# Patient Record
Sex: Female | Born: 1975 | Race: Black or African American | Hispanic: No | Marital: Married | Smoking: Never smoker
Health system: Southern US, Community
[De-identification: ages and names within clinical notes are randomized; demographics above are authoritative.]

## PROBLEM LIST (undated history)

## (undated) DIAGNOSIS — M1990 Unspecified osteoarthritis, unspecified site: Secondary | ICD-10-CM

## (undated) DIAGNOSIS — E669 Obesity, unspecified: Secondary | ICD-10-CM

## (undated) DIAGNOSIS — K5900 Constipation, unspecified: Secondary | ICD-10-CM

## (undated) DIAGNOSIS — K219 Gastro-esophageal reflux disease without esophagitis: Secondary | ICD-10-CM

## (undated) DIAGNOSIS — M21612 Bunion of left foot: Secondary | ICD-10-CM

## (undated) DIAGNOSIS — E559 Vitamin D deficiency, unspecified: Secondary | ICD-10-CM

## (undated) HISTORY — DX: Constipation, unspecified: K59.00

## (undated) HISTORY — PX: BUNIONECTOMY: SHX129

## (undated) HISTORY — PX: OTHER SURGICAL HISTORY: SHX169

## (undated) HISTORY — DX: Obesity, unspecified: E66.9

## (undated) HISTORY — PX: WISDOM TOOTH EXTRACTION: SHX21

## (undated) HISTORY — PX: BACK SURGERY: SHX140

## (undated) HISTORY — DX: Gastro-esophageal reflux disease without esophagitis: K21.9

## (undated) HISTORY — DX: Vitamin D deficiency, unspecified: E55.9

---

## 1998-08-03 ENCOUNTER — Emergency Department (HOSPITAL_COMMUNITY): Admission: EM | Admit: 1998-08-03 | Discharge: 1998-08-03 | Payer: Self-pay | Admitting: Emergency Medicine

## 1998-08-12 ENCOUNTER — Emergency Department (HOSPITAL_COMMUNITY): Admission: EM | Admit: 1998-08-12 | Discharge: 1998-08-12 | Payer: Self-pay | Admitting: Emergency Medicine

## 2001-11-27 ENCOUNTER — Other Ambulatory Visit: Admission: RE | Admit: 2001-11-27 | Discharge: 2001-11-27 | Payer: Self-pay | Admitting: Internal Medicine

## 2002-01-27 ENCOUNTER — Encounter: Admission: RE | Admit: 2002-01-27 | Discharge: 2002-04-27 | Payer: Self-pay | Admitting: Internal Medicine

## 2003-03-04 ENCOUNTER — Other Ambulatory Visit: Admission: RE | Admit: 2003-03-04 | Discharge: 2003-03-04 | Payer: Self-pay | Admitting: Gynecology

## 2004-03-08 ENCOUNTER — Other Ambulatory Visit: Admission: RE | Admit: 2004-03-08 | Discharge: 2004-03-08 | Payer: Self-pay | Admitting: Gynecology

## 2004-04-25 ENCOUNTER — Encounter (INDEPENDENT_AMBULATORY_CARE_PROVIDER_SITE_OTHER): Payer: Self-pay | Admitting: Specialist

## 2004-04-25 ENCOUNTER — Inpatient Hospital Stay (HOSPITAL_COMMUNITY): Admission: RE | Admit: 2004-04-25 | Discharge: 2004-04-27 | Payer: Self-pay | Admitting: Obstetrics and Gynecology

## 2004-04-25 HISTORY — PX: EXPLORATORY LAPAROTOMY: SUR591

## 2005-04-24 ENCOUNTER — Inpatient Hospital Stay (HOSPITAL_COMMUNITY): Admission: RE | Admit: 2005-04-24 | Discharge: 2005-04-27 | Payer: Self-pay | Admitting: Gynecology

## 2005-06-11 ENCOUNTER — Other Ambulatory Visit: Admission: RE | Admit: 2005-06-11 | Discharge: 2005-06-11 | Payer: Self-pay | Admitting: Gynecology

## 2006-06-25 ENCOUNTER — Other Ambulatory Visit: Admission: RE | Admit: 2006-06-25 | Discharge: 2006-06-25 | Payer: Self-pay | Admitting: Obstetrics and Gynecology

## 2007-07-03 ENCOUNTER — Other Ambulatory Visit: Admission: RE | Admit: 2007-07-03 | Discharge: 2007-07-03 | Payer: Self-pay | Admitting: Obstetrics and Gynecology

## 2008-07-13 ENCOUNTER — Other Ambulatory Visit: Admission: RE | Admit: 2008-07-13 | Discharge: 2008-07-13 | Payer: Self-pay | Admitting: Obstetrics and Gynecology

## 2009-04-06 ENCOUNTER — Ambulatory Visit: Payer: Self-pay | Admitting: Obstetrics and Gynecology

## 2009-07-18 ENCOUNTER — Ambulatory Visit: Payer: Self-pay | Admitting: Obstetrics and Gynecology

## 2009-07-18 ENCOUNTER — Other Ambulatory Visit: Admission: RE | Admit: 2009-07-18 | Discharge: 2009-07-18 | Payer: Self-pay | Admitting: Obstetrics and Gynecology

## 2009-07-18 ENCOUNTER — Encounter: Payer: Self-pay | Admitting: Obstetrics and Gynecology

## 2009-11-03 ENCOUNTER — Ambulatory Visit: Payer: Self-pay | Admitting: Obstetrics and Gynecology

## 2010-01-12 ENCOUNTER — Ambulatory Visit: Payer: Self-pay | Admitting: Women's Health

## 2010-06-21 ENCOUNTER — Ambulatory Visit: Payer: Self-pay | Admitting: Obstetrics and Gynecology

## 2010-07-20 ENCOUNTER — Other Ambulatory Visit: Admission: RE | Admit: 2010-07-20 | Discharge: 2010-07-20 | Payer: Self-pay | Admitting: Obstetrics and Gynecology

## 2010-07-20 ENCOUNTER — Ambulatory Visit: Payer: Self-pay | Admitting: Obstetrics and Gynecology

## 2011-01-05 ENCOUNTER — Ambulatory Visit (INDEPENDENT_AMBULATORY_CARE_PROVIDER_SITE_OTHER): Payer: BC Managed Care – PPO | Admitting: Obstetrics and Gynecology

## 2011-01-05 DIAGNOSIS — N912 Amenorrhea, unspecified: Secondary | ICD-10-CM

## 2011-01-09 ENCOUNTER — Other Ambulatory Visit: Payer: BC Managed Care – PPO

## 2011-01-09 ENCOUNTER — Ambulatory Visit (INDEPENDENT_AMBULATORY_CARE_PROVIDER_SITE_OTHER): Payer: BC Managed Care – PPO | Admitting: Obstetrics and Gynecology

## 2011-01-09 DIAGNOSIS — N912 Amenorrhea, unspecified: Secondary | ICD-10-CM

## 2011-01-09 DIAGNOSIS — O3410 Maternal care for benign tumor of corpus uteri, unspecified trimester: Secondary | ICD-10-CM

## 2011-01-09 DIAGNOSIS — O9989 Other specified diseases and conditions complicating pregnancy, childbirth and the puerperium: Secondary | ICD-10-CM

## 2011-02-01 ENCOUNTER — Ambulatory Visit (INDEPENDENT_AMBULATORY_CARE_PROVIDER_SITE_OTHER): Payer: BC Managed Care – PPO | Admitting: Obstetrics and Gynecology

## 2011-02-01 DIAGNOSIS — N898 Other specified noninflammatory disorders of vagina: Secondary | ICD-10-CM

## 2011-02-01 DIAGNOSIS — B373 Candidiasis of vulva and vagina: Secondary | ICD-10-CM

## 2011-02-12 ENCOUNTER — Other Ambulatory Visit: Payer: Self-pay | Admitting: Obstetrics & Gynecology

## 2011-02-28 ENCOUNTER — Ambulatory Visit (HOSPITAL_COMMUNITY)
Admission: RE | Admit: 2011-02-28 | Discharge: 2011-02-28 | Disposition: A | Discharge status: Home / Self Care | Payer: BC Managed Care – PPO | Source: Ambulatory Visit | Attending: Obstetrics & Gynecology | Admitting: Obstetrics & Gynecology

## 2011-02-28 ENCOUNTER — Other Ambulatory Visit: Payer: Self-pay | Admitting: Obstetrics & Gynecology

## 2011-02-28 DIAGNOSIS — O021 Missed abortion: Secondary | ICD-10-CM | POA: Insufficient documentation

## 2011-02-28 HISTORY — PX: DILATION AND EVACUATION: SHX1459

## 2011-02-28 LAB — CBC
Hemoglobin: 13.1 g/dL (ref 12.0–15.0)
Platelets: 244 10*3/uL (ref 150–400)
RBC: 4.42 MIL/uL (ref 3.87–5.11)
WBC: 6 10*3/uL (ref 4.0–10.5)

## 2011-02-28 LAB — ABO/RH: ABO/RH(D): O POS

## 2011-03-05 NOTE — Op Note (Signed)
  [name], [name]                ACCOUNT NO.:  [id number]  MEDICAL RECORD NO.:  [id number]           PATIENT TYPE:  O  LOCATION:  [facility]                          [facility]  PHYSICIAN:  Ilda Mori, M.D.   DATE OF BIRTH:  12-07-1975  DATE OF PROCEDURE:  02/28/2011 DATE OF DISCHARGE:                              OPERATIVE REPORT   PREOPERATIVE DIAGNOSIS:  Missed abortion.  POSTOPERATIVE DIAGNOSIS:  Missed abortion.  PROCEDURE:  Dilatation and evacuation.  SURGEON:  Ilda Mori, MD  ANESTHESIA:  MAC with a paracervical block.  FINDINGS:  Products of conception consistent with 9 weeks pregnancy.  ESTIMATED BLOOD LOSS:  20 mL.  COMPLICATIONS:  None.  INDICATIONS:  This is a 35 year old gravida 2, para 1 female who presented to the office in early pregnancy.  The patient had an ultrasound done on January 09, 2011, which showed a 6-week fetal pole with a positive heartbeat.  Today, she came in for a followup ultrasound without any symptoms of bleeding or cramping.  The ultrasound showed an 8-week fetal pole with no fetal heartbeat.  Based upon the lack of fetal heart tones and the lack of growth in the 6-week interim period, the diagnosis of missed abortion was made.  The patient was informed, and she elected to proceed with dilatation and evacuation.  PROCEDURE:  The patient was taken to the operating room and IV sedation was administered.  She was prepped and draped in sterile fashion.  A 10 mL of 2% lidocaine was infiltrated in the paracervical tissues.  The internal os was dilated with Shawnie Pons dilators.  A 25-French and #8 suction curette was introduced into the endometrial cavity, and the products of conception were evacuated.  The procedure was then terminated, and the patient left the operating room in good condition.     Ilda Mori, M.D.    RK/MEDQ  D:  02/28/2011  T:  11-Mar-2011  Job:  [id number]  Electronically Signed by Ilda Mori M.D. on  03/05/2011 02:04:24 PM

## 2011-03-20 DEATH — deceased

## 2011-04-06 NOTE — Op Note (Signed)
[name], [name]                            ACCOUNT NO.:  [id number]   MEDICAL RECORD NO.:  [id number]                   PATIENT TYPE:  INP   LOCATION:  X010                                 FACILITY:  [facility]   PHYSICIAN:  Daniel L. Eda Paschal, M.D.           DATE OF BIRTH:  1976/05/22   DATE OF PROCEDURE:  04/25/2004  DATE OF DISCHARGE:                                 OPERATIVE REPORT   PREOPERATIVE DIAGNOSES:  1. Fibroid.  2. Pelvic pain.  3. Probable right ovarian cyst.   POSTOPERATIVE DIAGNOSES:  1. Fibroid.  2. Pelvic pain.   OPERATION:  Exploratory laparotomy with myomectomy.   SURGEON:  Dr. Eda Paschal.   FIRST ASSISTANT:  Dr. Rosalia Hammers   FINDINGS:  At the time of the surgery, patient had a very large, 8-9 cm  fibroid that was bilobed and almost gave the appearance of being two  fibroids which it might have been, but I think it was just one very large,  bilobed fibroid.  It originated in her interligamentary area on the right  side near the uterine artery in front of the round ligament and went down  into the broad ligament and also into the anterior portion of the uterus.  Fallopian tubes were normal with luxuriant fimbria.  Ovaries were normal.  The previous ovarian cyst seen on ultrasound had resolved.  Pelvic  peritoneum was free of any disease.  The rest of the exploration of the  abdomen was normal.   DESCRIPTION OF PROCEDURE:  After adequate general anesthesia, the patient  was placed in the supine position, prepped and draped in the usual sterile  manner.  A Foley catheter was inserted in the patient's bladder.  A  pediatric Foley was inserted into the patient's uterus.  A Pfannenstiel  incision was made.  The fascia was opened transversely.  The peritoneum was  entered vertically.  Subcutaneous bleeders were clamped and Bovie'd as  encountered.  When the peritoneal cavity was opened, initially washings were  obtained because of concern about a right ovarian  neoplasm, but they were  discarded after the adnexa were examined and noted to be free of any  disease.  The myoma was easily identified.  It was deviating the uterus.  The peritoneum over the myoma was dissected free in order to see the myoma  better.  Pitressin was utilized in a 20 and 100 mL dilution solution, and  then the myoma was cut into.  It quickly became obvious that it was a  bilobed myoma.  It was dissected free from surrounding structures.  It did  not really appear to go deep into the myometrium whatsoever and was more of  a superficial myoma than a deep myoma.  It clearly did not get anywhere near  the endometrial cavity.  A uterine artery was ligated and bled, but it was  quickly ligated doubly with 0 Vicryl.  Finally, the entire  structure was  removed.  The myometrial defect was closed with a running locking 0 Vicryl.  Several additional 0 Vicryl figure-of-eights were done to control bleeding.  Indigo carmine had been introduced prior to starting the dissection, and  both tubes were patent. It was then reintroduced after the procedure, and  the tubes were still patent.  The serosal defect was closed with a running 0  Prolene.  Copious irrigation was done with sterile saline.  Two sponge,  needle, and instrument counts were correct.  Intercede was placed over the  serosal defect.  The peritoneum was closed with a running 0  Vicryl.  The fascia was closed with two running 0 Vicryl, and the skin was  closed with staples.  Estimated blood loss for the entire procedure was 450  mL with none replaced.  The patient tolerated the procedure well and left  the operating room in satisfactory condition draining clear urine from her  Foley catheter.                                               Daniel L. Eda Paschal, M.D.    [name]  D:  04/25/2004  T:  04/25/2004  Job:  [id number]

## 2011-04-06 NOTE — Discharge Summary (Signed)
NAMEMARILENA, [name]                            ACCOUNT NO.:  [id number]   MEDICAL RECORD NO.:  [id number]                   PATIENT TYPE:  INP   LOCATION:  0448                                 FACILITY:  [facility]   PHYSICIAN:  Daniel L. Eda Paschal, M.D.           DATE OF BIRTH:  03-23-1976   DATE OF ADMISSION:  04/25/2004  DATE OF DISCHARGE:  04/27/2004                                 DISCHARGE SUMMARY   HISTORY OF PRESENT ILLNESS:  The patient is a 35 year old female who was  admitted to the hospital with pelvic pain and a large fibroid for definitive  surgery.   HOSPITAL COURSE:  On the day of admission, she was taken to the operating  room.  An exploratory laparotomy and myomectomy was performed.  Postoperatively she did well, and by the second postoperative day she was  voiding well, tolerating a diet, and was ready for discharge.   DISCHARGE MEDICATIONS:  Hydrocodone 5 mg for pain relief.   WOUND CARE:  Routine.   FOLLOW UP:  She will return to the office tomorrow for staple removal.   The final pathology report is not available at the time of dictation.   CONDITION ON DISCHARGE:  Improved.   DISCHARGE DIAGNOSIS:  Large fibroid with pelvic pain.   OPERATION:  Myomectomy done abdominally.                                               Daniel L. Eda Paschal, M.D.    [name]  D:  04/27/2004  T:  04/27/2004  Job:  [id number]

## 2011-04-06 NOTE — Op Note (Signed)
[name], [name]                ACCOUNT NO.:  [id number]   MEDICAL RECORD NO.:  [id number]          PATIENT TYPE:  INP   LOCATION:  9123                          [facility]   PHYSICIAN:  Juan H. Lily Peer, M.D.DATE OF BIRTH:  02-11-1976   DATE OF PROCEDURE:  04/24/2005  DATE OF DISCHARGE:                                 OPERATIVE REPORT   PREOPERATIVE DIAGNOSES:  1.  Term intrauterine pregnancy.  2.  Prior myomectomy.   POSTOPERATIVE DIAGNOSES:  1.  Term intrauterine pregnancy.  2.  Prior myomectomy.   ANESTHESIA:  Spinal.   PROCEDURE PERFORMED:  Primary lower uterine segment transverse cesarean  section.   SURGEON:  Juan H. Lily Peer, M.D.   FIRST ASSISTANT:  Timothy P. Fontaine, M.D.   INDICATION FOR OPERATION:  A 35 year old gravida 2, para 0, AB 1, at 2  weeks' estimated gestational age, for a primary cesarean section due to the  fact that she had prior abdominal myomectomy.  Patient also with morbid  obesity.   DESCRIPTION OF OPERATION:  After the patient was adequately counseled, she  was taken to the operating room, where she underwent a successful spinal  placement.  The abdomen was prepped and draped in the usual sterile fashion.  A Foley catheter had been inserted in an effort to monitor urinary output.  A Pfannenstiel skin incision was made 2 cm above the symphysis.  The  incision was carried down from the skin and subcutaneous tissue down to the  rectus fascia, whereby a midline nick was made.  The fascia was incised in a  transverse fashion.  The peritoneal cavity was entered cautiously.  Although  there was an extensive amount of adhesions and scarring from previous  myomectomy, meticulous dissection with lysis of pelvic adhesions was  undertaken.  A muscle-splitting technique (Maylard) was utilized in an  effort to have adequate exposure.  The bladder flap was established.  The  lower uterine segment was incised in a transverse fashion.  Clear  amniotic  fluid was present.  The newborn's head was delivered with the assistance of  a Tender Touch vacuum.  The nasopharyngeal area was bulb-suctioned and the  newborn was delivered.  The cord was doubly clamped and excised, shown to  the parents and passed off to the pediatricians who were in attendance, who  gave the following parameters:  A viable female infant, Apgars of 7 and 8,  with a weight of 8 pounds and Apgars were 7 and 8.  The cord blood was  obtained.  The placenta was delivered from the intrauterine cavity.  The  lower uterine segment was closed in a two-layered fashion, first with a  locking stitch of 0 Vicryl suture, followed by a second layer of 0 Vicryl  suture in an imbricating manner.  Tubes and ovaries appeared to be normal,  and scarring from the anterior uterine wall from previous myomectomy was  evident but otherwise intact.  The uterus was placed back in the pelvic  cavity. The pelvic cavity was copiously irrigated with normal saline  solution.  The visceral peritoneum was  not reapproximated, but the rectus  fascia was closed with a running stitch of 0 Vicryl suture.  The  subcutaneous bleeders were Bovie cauterized.  The skin was reapproximated  with skin clips, followed by placement of Xeroform gauze and 4 x 8  dressings.  The patient was transferred to the  recovery room with stable vital signs.  Blood loss for the procedure was  recorded at 700 mL.  IV fluids:  3300 mL of lactated Ringer's. Urine output:  200 mL and clear.  The patient received 1 g of Cefotan prophylactically  after the cord was clamped.      JHF/MEDQ  D:  04/24/2005  T:  04/24/2005  Job:  [id number]

## 2011-04-06 NOTE — H&P (Signed)
[name], [name]                ACCOUNT NO.:  [id number]   MEDICAL RECORD NO.:  [id number]          PATIENT TYPE:  INP   LOCATION:  NA                            [facility]   PHYSICIAN:  Juan H. Lily Peer, M.D.DATE OF BIRTH:  05-Oct-1976   DATE OF ADMISSION:  DATE OF DISCHARGE:                                HISTORY & PHYSICAL   The patient is scheduled for surgery tomorrow April 24, 2005 at 1:00 p.m. at  [facility], please have history and physical available.   CHIEF COMPLAINT:  1.  Term intrauterine pregnancy.  2.  Leiomyomatous uteri.  3.  History of prior abdominal myomectomy.  4.  Morbid obesity.   HISTORY:  The patient is a 35 year old gravida 2, para 0, AB 1, with  corrected estimated date of confinement of May 02, 2005, the patient will  be [redacted] weeks gestation at the time of her planned primary cesarean section.  The patient with history of abdominal myomectomy in 2005 and has history of  several fibroids on her uterus.  Due to the patients morbid obesity, she was  sent to [facility] for a Level III Scan for better assessment of the  baby, and no gross anomalies was noted.  Initially there was low to normal  volume of the amniotic fluid, but on followup ultrasound the amniotic fluid  volumes were normal and good interval growth.  The patient at first prenatal  visit was weighing 248 pounds, at the last office visit on Apr 17, 2005 she  was up to 300 pounds.  She did have a normal diabetes screen and negative  GBS culture.  The patient had declined cystic fibrosis screen as well as a  alpha-fetoprotein testing.  The remainder of her prenatal course was  otherwise unremarkable.   PAST MEDICAL HISTORY:  SHE DENIES ANY ALLERGIES.  She has had one elective  abortion in 1997.  She has cats at home.  Toxoplasmosis screen was negative  in our office.   REVIEW OF SYSTEMS:  See [place] form.   PHYSICAL EXAMINATION:  Blood pressure 104/72, urine with trace  protein,  negative glucose.  HEENT:  Unremarkable, neck supple, trachea midline, no carotid bruits, no  thyromegaly.  LUNGS:  Clear to auscultation without rhonchi or wheezes.  HEART:  Regular rate and rhythm, no murmurs or gallops.  BREAST:  Exam not done.  ABDOMEN:  Gravid uterus, vertex presentation by [facility] maneuver, 39 cm  fundal height, cervix was long, closed and posterior.  EXTREMITIES:  DTR 1+, negative clonus, trace edema.   PRENATAL LABS:  O positive blood type.  Negative antibody screen.  VDRL was  not reactive.  Rubella immune.  Hepatitis B surface antigen and HIV were  negative.  The patient declined cystic fibrosis screening as well as  maternal __________ protein, diabetes screen was normal.  GBS culture was  negative.   ASSESSMENT:  A 35 year old gravida 2, para 1, AB 0 at 41 weeks estimated  gestational age with morbid obesity with history of abdominal myomectomy.  Risk of uterine rupture at trial of labor versus  a cesarean section have  been discussed with the patient.  The patient had opted to proceed with a  primary cesarean section.  The patient risks and benefits, pros and cons  were discussed including infection, bleeding, trauma to internal organs,  pulmonary embolism.  The patient fully aware and accepts, and will follow  accordingly.   PLAN:  The patient was scheduled for primary lower uterine segment  transverse cesarean section tomorrow, April 24, 2005 at [facility].  Please have History and Physical available.      JHF/MEDQ  D:  04/23/2005  T:  04/23/2005  Job:  [id number]

## 2011-04-06 NOTE — H&P (Signed)
[name], RADEBAUGH                            ACCOUNT NO.:  [id number]   MEDICAL RECORD NO.:  [id number]                   PATIENT TYPE:  INP   LOCATION:  NA                                   FACILITY[facility]  [facility]   PHYSICIAN:  Daniel L. Eda Paschal, M.D.           DATE OF BIRTH:  1976-06-11   DATE OF ADMISSION:  04/25/2004  DATE OF DISCHARGE:                                HISTORY & PHYSICAL   CHIEF COMPLAINT:  Fibroid with pelvic pain, right ovarian cyst.   HISTORY OF PRESENT ILLNESS:  The patient is a 35 year old gravida 1, para 0,  Ab1, who presented to the office in May of this year with severe lower  abdominal/pelvic pain.  Pelvic examination revealed a pelvic mass and  ultrasound revealed a 6 x 8-cm fibroid that appeared to be degenerating; in  addition to that, the patient has a 5 x 3-cm cyst on her right ovary.  Although her pain has gotten better, it is felt that the patient should go  ahead and have surgery to remove such a large fibroid in anticipation that  she may want to conceive down the road; she is, however, having a fair bit  of discomfort from it as well.  The plan will be to do a myomectomy and if  the right ovarian cyst is still present, as it has been a month, to do a  right ovarian cystectomy.  The patient has given me permission to do a right  salpingo-oophorectomy if that is more appropriate for treatment of the  adnexal mass and she does appreciate there is some risk of hysterectomy when  you do a myomectomy.   PAST MEDICAL HISTORY:  No previous medical problems.  She had a cyst removed  from her right eye.   MEDICATIONS:  She takes Estrostep for birth control.   ALLERGIES:  She is allergic to no drugs.   FAMILY HISTORY:  She has an aunt who is diabetic and mother and grandfather  who are hypertensive.  Maternal grandparents have had pancreatic and bone  cancer on the mother's side of the family.   SOCIAL HISTORY:  The patient is a Midwife.   She is a nonsmoker,  nondrinker.   REVIEW OF SYSTEMS:  HEENT:  Negative.  CARDIAC:  Negative.  RESPIRATORY:  Negative.  GI:  Negative except for lower abdominal pain.  GU:  Negative.  NEUROMUSCULAR:  Negative.  NEUROLOGICAL/PSYCHIATRIC:  Negative.  IMMUNOLOGICAL, LYMPHATIC AND ENDOCRINE:  Negative.   PHYSICAL EXAMINATION:  GENERAL:  The patient is a well-developed and well-  nourished female in no acute distress.  VITAL SIGNS:  Her blood pressure is 102/64, pulse is 80 and regular,  respirations 16 and nonlabored, she is afebrile.  HEENT:  HEENT is all within normal limits.  NECK:  Neck is supple, trachea in the midline, thyroid is not enlarged.  LUNGS: Lungs are clear to P&A.  HEART:  No thrills, heaves or murmurs.  BREASTS:  No masses.  ABDOMEN:  Abdomen is soft without guarding, rebound or masses.  PELVIC:  External and vaginal are normal.  Cervix is clean.  Pap smear shows  no atypia.  Uterus is enlarged by a pelvic mass of about 8 cm.  Adnexa are  palpably normal, as I cannot differentiate the right adnexal mass from the  fibroid.  Rectovaginal is confirmatory.  EXTREMITIES:  Extremities are within normal limits.   ADMITTING IMPRESSION:  1. Large fibroid with pain.  2  Right ovarian cyst.   PLAN:  Exploratory laparotomy with treatment as outlined above.                                               Daniel L. Eda Paschal, M.D.    Tonette Bihari  D:  04/24/2004  T:  04/24/2004  Job:  [id number]

## 2011-04-06 NOTE — Discharge Summary (Signed)
[name], [name]                ACCOUNT NO.:  [id number]   MEDICAL RECORD NO.:  [id number]          PATIENT TYPE:  INP   LOCATION:  9123                          [facility]ACILITY[facility]   PHYSICIAN:  Ivor Costa. Farrel Gobble, M.D. DATE OF BIRTH:  04/20/1976   DATE OF ADMISSION:  04/24/2005  DATE OF DISCHARGE:  04/27/2005                                 DISCHARGE SUMMARY   PRINCIPAL DIAGNOSES:  Term pregnancy.   ADDITIONAL DIAGNOSES:  previos myomectomy .   PRINCIPAL PROCEDURE:  Primary cesarean section.   HOSPITAL COURSE:  Patient presented on the morning of April 24, 2005 and  underwent a primary cesarean section low flap transverse secondary to  history of prior myomectomy with delivery of a viable female with Apgars of  7/8, birth weight of 8 pounds under epidural anesthesia.  Her postoperative  course was unremarkable.  She was transferred to the PACU and then to  postoperative floor in due fashion.  By postoperative day #3 the patient was  ready for discharge.  She was breast-feeding without any difficulty, using  only over-the-counter Motrin for pain relief.   PHYSICAL EXAMINATION:  HEART:  Regular rate.  LUNGS:  Clear to auscultation.  ABDOMEN:  Obese, soft, nontender with bowel sounds.  Incision was clean,  dry, intact with staples.  GENITOURINARY:  Uterus was firm, nontender and not to the umbilicus.  EXTREMITIES:  Nontender.   LABORATORIES:  Postoperative hemoglobin 10.4, hematocrit 30.8, platelets  217, white count 10.   DISPOSITION:  Stable.  The patient was discharged home with instructions to  follow up in our office in six weeks.  She will continue her over-the-  counter Motrin.  The staples will be removed prior to discharge.       THL/MEDQ  D:  04/27/2005  T:  04/27/2005  Job:  [id number]

## 2011-10-22 ENCOUNTER — Other Ambulatory Visit (HOSPITAL_COMMUNITY): Payer: Self-pay | Admitting: Obstetrics & Gynecology

## 2011-10-30 ENCOUNTER — Ambulatory Visit (HOSPITAL_COMMUNITY)
Admission: RE | Admit: 2011-10-30 | Discharge: 2011-10-30 | Disposition: A | Discharge status: Home / Self Care | Payer: BC Managed Care – PPO | Source: Ambulatory Visit | Attending: Obstetrics & Gynecology | Admitting: Obstetrics & Gynecology

## 2011-10-30 DIAGNOSIS — O200 Threatened abortion: Secondary | ICD-10-CM | POA: Insufficient documentation

## 2011-11-28 LAB — OB RESULTS CONSOLE ANTIBODY SCREEN: Antibody Screen: NEGATIVE

## 2011-11-28 LAB — OB RESULTS CONSOLE ABO/RH

## 2011-11-28 LAB — OB RESULTS CONSOLE RUBELLA ANTIBODY, IGM: Rubella: IMMUNE

## 2012-06-02 ENCOUNTER — Encounter (HOSPITAL_COMMUNITY): Payer: Self-pay | Admitting: Pharmacist

## 2012-06-04 ENCOUNTER — Encounter (HOSPITAL_COMMUNITY): Payer: Self-pay

## 2012-06-05 ENCOUNTER — Encounter (HOSPITAL_COMMUNITY)
Admission: RE | Admit: 2012-06-05 | Discharge: 2012-06-05 | Disposition: A | Discharge status: Home / Self Care | Payer: BC Managed Care – PPO | Source: Ambulatory Visit | Attending: Obstetrics & Gynecology | Admitting: Obstetrics & Gynecology

## 2012-06-05 ENCOUNTER — Encounter (HOSPITAL_COMMUNITY): Payer: Self-pay

## 2012-06-05 HISTORY — DX: Unspecified osteoarthritis, unspecified site: M19.90

## 2012-06-05 LAB — TYPE AND SCREEN: Antibody Screen: NEGATIVE

## 2012-06-05 LAB — CBC
HCT: 34.4 % — ABNORMAL LOW (ref 36.0–46.0)
MCHC: 31.7 g/dL (ref 30.0–36.0)
Platelets: 253 10*3/uL (ref 150–400)
RDW: 14.5 % (ref 11.5–15.5)
WBC: 6.1 10*3/uL (ref 4.0–10.5)

## 2012-06-05 LAB — SURGICAL PCR SCREEN
MRSA, PCR: NEGATIVE
Staphylococcus aureus: NEGATIVE

## 2012-06-05 NOTE — Patient Instructions (Addendum)
   Your procedure is scheduled on: Monday, July 22  Enter through the Main Entrance of [facility] at: 2:30 pm Pick up the phone at the desk and dial [phone] and inform us of your arrival.  Please call this number if you have any problems the morning of surgery: [phone]  Remember: Do not eat food after midnight: Sunday Do not drink clear liquids after: 12 Noon Monday Take these medicines the morning of surgery with a SIP OF WATER: Prenatal vitamin prior to 12 Noon Day of Surgery  Do not wear jewelry, make-up, or FINGER nail polish No metal in your hair or on your body. Do not wear lotions, powders, perfumes or deodorant. Do not shave 48 hours prior to surgery. Do not bring valuables to the hospital. Contacts, dentures or bridgework may not be worn into surgery.  Leave suitcase in the car. After Surgery it may be brought to your room. For patients being admitted to the hospital, checkout time is 11:00am the day of discharge. Home with husband Tim  cell [phone].  Patients discharged on the day of surgery will not be allowed to drive home.     Remember to use your hibiclens as instructed.Please shower with 1/2 bottle the evening before your surgery and the other 1/2 bottle the morning of surgery. Neck down avoiding private area.

## 2012-06-06 LAB — RPR: RPR Ser Ql: NONREACTIVE

## 2012-06-08 MED ORDER — DEXTROSE 5 % IV SOLN
3.0000 g | INTRAVENOUS | Status: AC
  Administered 2012-06-09: 3 g via INTRAVENOUS
  Filled 2012-06-08: qty 3000

## 2012-06-09 ENCOUNTER — Inpatient Hospital Stay (HOSPITAL_COMMUNITY): Payer: BC Managed Care – PPO

## 2012-06-09 ENCOUNTER — Encounter (HOSPITAL_COMMUNITY): Payer: Self-pay | Admitting: *Deleted

## 2012-06-09 ENCOUNTER — Inpatient Hospital Stay (HOSPITAL_COMMUNITY)
Admission: RE | Admit: 2012-06-09 | Discharge: 2012-06-11 | DRG: 371 | Disposition: A | Discharge status: Home / Self Care | Payer: BC Managed Care – PPO | Source: Ambulatory Visit | Attending: Obstetrics & Gynecology | Admitting: Obstetrics & Gynecology

## 2012-06-09 ENCOUNTER — Encounter (HOSPITAL_COMMUNITY): Payer: Self-pay | Admitting: Anesthesiology

## 2012-06-09 ENCOUNTER — Encounter (HOSPITAL_COMMUNITY)
Admission: RE | Disposition: A | Discharge status: Home / Self Care | Payer: Self-pay | Source: Ambulatory Visit | Attending: Obstetrics & Gynecology

## 2012-06-09 ENCOUNTER — Encounter (HOSPITAL_COMMUNITY): Payer: Self-pay

## 2012-06-09 DIAGNOSIS — O09529 Supervision of elderly multigravida, unspecified trimester: Secondary | ICD-10-CM | POA: Diagnosis present

## 2012-06-09 DIAGNOSIS — Z01812 Encounter for preprocedural laboratory examination: Secondary | ICD-10-CM

## 2012-06-09 DIAGNOSIS — O34219 Maternal care for unspecified type scar from previous cesarean delivery: Principal | ICD-10-CM | POA: Diagnosis present

## 2012-06-09 DIAGNOSIS — Z3480 Encounter for supervision of other normal pregnancy, unspecified trimester: Secondary | ICD-10-CM

## 2012-06-09 DIAGNOSIS — Z01818 Encounter for other preprocedural examination: Secondary | ICD-10-CM

## 2012-06-09 SURGERY — Surgical Case
Anesthesia: Spinal | Site: Abdomen | Wound class: Clean Contaminated

## 2012-06-09 MED ORDER — MORPHINE SULFATE 0.5 MG/ML IJ SOLN
INTRAMUSCULAR | Status: AC
  Filled 2012-06-09: qty 10

## 2012-06-09 MED ORDER — KETOROLAC TROMETHAMINE 30 MG/ML IJ SOLN
30.0000 mg | Freq: Four times a day (QID) | INTRAMUSCULAR | Status: AC | PRN
  Administered 2012-06-10: 30 mg via INTRAVENOUS
  Filled 2012-06-09: qty 1

## 2012-06-09 MED ORDER — LACTATED RINGERS IV SOLN
INTRAVENOUS | Status: DC
  Administered 2012-06-09: 22:00:00 via INTRAVENOUS

## 2012-06-09 MED ORDER — SCOPOLAMINE 1 MG/3DAYS TD PT72
1.0000 | MEDICATED_PATCH | Freq: Once | TRANSDERMAL | Status: AC
  Administered 2012-06-09: 1.5 mg via TRANSDERMAL

## 2012-06-09 MED ORDER — IBUPROFEN 600 MG PO TABS
600.0000 mg | ORAL_TABLET | Freq: Four times a day (QID) | ORAL | Status: DC
  Administered 2012-06-10 – 2012-06-11 (×6): 600 mg via ORAL
  Filled 2012-06-09 (×6): qty 1

## 2012-06-09 MED ORDER — PHENYLEPHRINE 40 MCG/ML (10ML) SYRINGE FOR IV PUSH (FOR BLOOD PRESSURE SUPPORT)
PREFILLED_SYRINGE | INTRAVENOUS | Status: AC
  Filled 2012-06-09: qty 15

## 2012-06-09 MED ORDER — FENTANYL CITRATE 0.05 MG/ML IJ SOLN
INTRAMUSCULAR | Status: DC | PRN
  Administered 2012-06-09: 15 ug via INTRATHECAL

## 2012-06-09 MED ORDER — KETOROLAC TROMETHAMINE 30 MG/ML IJ SOLN: 30.0000 mg | Freq: Four times a day (QID) | INTRAMUSCULAR | Status: AC | PRN

## 2012-06-09 MED ORDER — IBUPROFEN 600 MG PO TABS: 600.0000 mg | ORAL_TABLET | Freq: Four times a day (QID) | ORAL | Status: DC | PRN

## 2012-06-09 MED ORDER — LACTATED RINGERS IV SOLN
INTRAVENOUS | Status: DC
  Administered 2012-06-09: 125 mL/h via INTRAVENOUS
  Administered 2012-06-09: 17:00:00 via INTRAVENOUS

## 2012-06-09 MED ORDER — ONDANSETRON HCL 4 MG PO TABS: 4.0000 mg | ORAL_TABLET | ORAL | Status: DC | PRN

## 2012-06-09 MED ORDER — NALBUPHINE HCL 10 MG/ML IJ SOLN: 5.0000 mg | INTRAMUSCULAR | Status: DC | PRN

## 2012-06-09 MED ORDER — WITCH HAZEL-GLYCERIN EX PADS: 1.0000 "application " | MEDICATED_PAD | CUTANEOUS | Status: DC | PRN

## 2012-06-09 MED ORDER — KETOROLAC TROMETHAMINE 60 MG/2ML IM SOLN
60.0000 mg | Freq: Once | INTRAMUSCULAR | Status: AC | PRN
  Administered 2012-06-09: 60 mg via INTRAMUSCULAR

## 2012-06-09 MED ORDER — FENTANYL CITRATE 0.05 MG/ML IJ SOLN: 25.0000 ug | INTRAMUSCULAR | Status: DC | PRN

## 2012-06-09 MED ORDER — OXYCODONE-ACETAMINOPHEN 5-325 MG PO TABS
1.0000 | ORAL_TABLET | ORAL | Status: DC | PRN
  Administered 2012-06-10 – 2012-06-11 (×3): 1 via ORAL
  Filled 2012-06-09 (×3): qty 1

## 2012-06-09 MED ORDER — MENTHOL 3 MG MT LOZG: 1.0000 | LOZENGE | OROMUCOSAL | Status: DC | PRN

## 2012-06-09 MED ORDER — 0.9 % SODIUM CHLORIDE (POUR BTL) OPTIME
TOPICAL | Status: DC | PRN
  Administered 2012-06-09: 1000 mL

## 2012-06-09 MED ORDER — OXYTOCIN 40 UNITS IN LACTATED RINGERS INFUSION - SIMPLE MED: 62.5000 mL/h | INTRAVENOUS | Status: AC

## 2012-06-09 MED ORDER — SODIUM CHLORIDE 0.9 % IV SOLN: 1.0000 ug/kg/h | INTRAVENOUS | Status: DC | PRN

## 2012-06-09 MED ORDER — METOCLOPRAMIDE HCL 5 MG/ML IJ SOLN: 10.0000 mg | Freq: Three times a day (TID) | INTRAMUSCULAR | Status: DC | PRN

## 2012-06-09 MED ORDER — PHENYLEPHRINE HCL 10 MG/ML IJ SOLN
INTRAMUSCULAR | Status: DC | PRN
  Administered 2012-06-09 (×5): 80 ug via INTRAVENOUS

## 2012-06-09 MED ORDER — SIMETHICONE 80 MG PO CHEW
80.0000 mg | CHEWABLE_TABLET | Freq: Three times a day (TID) | ORAL | Status: DC
  Administered 2012-06-09 – 2012-06-11 (×8): 80 mg via ORAL

## 2012-06-09 MED ORDER — MORPHINE SULFATE (PF) 0.5 MG/ML IJ SOLN
INTRAMUSCULAR | Status: DC | PRN
  Administered 2012-06-09: .1 mg via INTRATHECAL

## 2012-06-09 MED ORDER — ONDANSETRON HCL 4 MG/2ML IJ SOLN: 4.0000 mg | INTRAMUSCULAR | Status: DC | PRN

## 2012-06-09 MED ORDER — NALOXONE HCL 0.4 MG/ML IJ SOLN: 0.4000 mg | INTRAMUSCULAR | Status: DC | PRN

## 2012-06-09 MED ORDER — SCOPOLAMINE 1 MG/3DAYS TD PT72: 1.0000 | MEDICATED_PATCH | Freq: Once | TRANSDERMAL | Status: DC

## 2012-06-09 MED ORDER — ONDANSETRON HCL 4 MG/2ML IJ SOLN: 4.0000 mg | Freq: Three times a day (TID) | INTRAMUSCULAR | Status: DC | PRN

## 2012-06-09 MED ORDER — TETANUS-DIPHTH-ACELL PERTUSSIS 5-2.5-18.5 LF-MCG/0.5 IM SUSP
0.5000 mL | Freq: Once | INTRAMUSCULAR | Status: AC
  Administered 2012-06-10: 0.5 mL via INTRAMUSCULAR
  Filled 2012-06-09: qty 0.5

## 2012-06-09 MED ORDER — ZOLPIDEM TARTRATE 5 MG PO TABS: 5.0000 mg | ORAL_TABLET | Freq: Every evening | ORAL | Status: DC | PRN

## 2012-06-09 MED ORDER — DIPHENHYDRAMINE HCL 25 MG PO CAPS: 25.0000 mg | ORAL_CAPSULE | Freq: Four times a day (QID) | ORAL | Status: DC | PRN

## 2012-06-09 MED ORDER — MEPERIDINE HCL 25 MG/ML IJ SOLN: 6.2500 mg | INTRAMUSCULAR | Status: DC | PRN

## 2012-06-09 MED ORDER — KETOROLAC TROMETHAMINE 60 MG/2ML IM SOLN
INTRAMUSCULAR | Status: AC
  Administered 2012-06-09: 60 mg via INTRAMUSCULAR
  Filled 2012-06-09: qty 2

## 2012-06-09 MED ORDER — DIPHENHYDRAMINE HCL 25 MG PO CAPS: 25.0000 mg | ORAL_CAPSULE | ORAL | Status: DC | PRN

## 2012-06-09 MED ORDER — DIPHENHYDRAMINE HCL 50 MG/ML IJ SOLN: 25.0000 mg | INTRAMUSCULAR | Status: DC | PRN

## 2012-06-09 MED ORDER — LANOLIN HYDROUS EX OINT: 1.0000 "application " | TOPICAL_OINTMENT | CUTANEOUS | Status: DC | PRN

## 2012-06-09 MED ORDER — DIPHENHYDRAMINE HCL 50 MG/ML IJ SOLN: 12.5000 mg | INTRAMUSCULAR | Status: DC | PRN

## 2012-06-09 MED ORDER — SIMETHICONE 80 MG PO CHEW: 80.0000 mg | CHEWABLE_TABLET | ORAL | Status: DC | PRN

## 2012-06-09 MED ORDER — OXYTOCIN 10 UNIT/ML IJ SOLN
40.0000 [IU] | INTRAMUSCULAR | Status: DC | PRN
  Administered 2012-06-09: 40 [IU] via INTRAVENOUS

## 2012-06-09 MED ORDER — ONDANSETRON HCL 4 MG/2ML IJ SOLN
INTRAMUSCULAR | Status: DC | PRN
  Administered 2012-06-09: 4 mg via INTRAVENOUS

## 2012-06-09 MED ORDER — SCOPOLAMINE 1 MG/3DAYS TD PT72
MEDICATED_PATCH | TRANSDERMAL | Status: AC
  Administered 2012-06-09: 1.5 mg via TRANSDERMAL
  Filled 2012-06-09: qty 1

## 2012-06-09 MED ORDER — SODIUM CHLORIDE 0.9 % IJ SOLN: 3.0000 mL | INTRAMUSCULAR | Status: DC | PRN

## 2012-06-09 MED ORDER — PRENATAL MULTIVITAMIN CH
1.0000 | ORAL_TABLET | Freq: Every day | ORAL | Status: DC
  Administered 2012-06-10 – 2012-06-11 (×2): 1 via ORAL
  Filled 2012-06-09: qty 1

## 2012-06-09 MED ORDER — ONDANSETRON HCL 4 MG/2ML IJ SOLN
INTRAMUSCULAR | Status: AC
  Filled 2012-06-09: qty 2

## 2012-06-09 MED ORDER — FENTANYL CITRATE 0.05 MG/ML IJ SOLN
INTRAMUSCULAR | Status: AC
  Filled 2012-06-09: qty 2

## 2012-06-09 MED ORDER — BUPIVACAINE IN DEXTROSE 0.75-8.25 % IT SOLN
INTRATHECAL | Status: DC | PRN
  Administered 2012-06-09: 1.5 mL via INTRATHECAL

## 2012-06-09 MED ORDER — DIBUCAINE 1 % RE OINT: 1.0000 "application " | TOPICAL_OINTMENT | RECTAL | Status: DC | PRN

## 2012-06-09 MED ORDER — OXYTOCIN 10 UNIT/ML IJ SOLN
INTRAMUSCULAR | Status: AC
  Filled 2012-06-09: qty 4

## 2012-06-09 MED ORDER — SENNOSIDES-DOCUSATE SODIUM 8.6-50 MG PO TABS
2.0000 | ORAL_TABLET | Freq: Every day | ORAL | Status: DC
  Administered 2012-06-09 – 2012-06-10 (×2): 2 via ORAL

## 2012-06-09 SURGICAL SUPPLY — 30 items
CLOTH BEACON ORANGE TIMEOUT ST (SAFETY) ×2 IMPLANT
CONTAINER PREFILL 10% NBF 15ML (MISCELLANEOUS) IMPLANT
ELECT REM PT RETURN 9FT ADLT (ELECTROSURGICAL) ×2
ELECTRODE REM PT RTRN 9FT ADLT (ELECTROSURGICAL) ×1 IMPLANT
EXTRACTOR VACUUM M CUP 4 TUBE (SUCTIONS) ×2 IMPLANT
GLOVE ECLIPSE 6.0 STRL STRAW (GLOVE) ×2 IMPLANT
GLOVE ECLIPSE 6.5 STRL STRAW (GLOVE) ×2 IMPLANT
GOWN PREVENTION PLUS LG XLONG (DISPOSABLE) ×6 IMPLANT
KIT ABG SYR 3ML LUER SLIP (SYRINGE) IMPLANT
NEEDLE HYPO 25X5/8 SAFETYGLIDE (NEEDLE) IMPLANT
NS IRRIG 1000ML POUR BTL (IV SOLUTION) ×2 IMPLANT
PACK C SECTION WH (CUSTOM PROCEDURE TRAY) ×2 IMPLANT
RTRCTR C-SECT PINK 25CM LRG (MISCELLANEOUS) ×2 IMPLANT
SLEEVE SCD COMPRESS KNEE MED (MISCELLANEOUS) IMPLANT
STAPLER VISISTAT 35W (STAPLE) ×2 IMPLANT
SUT PLAIN 0 NONE (SUTURE) IMPLANT
SUT PLAIN 2 0 XLH (SUTURE) ×2 IMPLANT
SUT VIC AB 0 CT1 27 (SUTURE) ×3
SUT VIC AB 0 CT1 27XBRD ANBCTR (SUTURE) ×3 IMPLANT
SUT VIC AB 1 CTX 36 (SUTURE) ×2
SUT VIC AB 1 CTX36XBRD ANBCTRL (SUTURE) ×2 IMPLANT
SUT VIC AB 3-0 CT1 27 (SUTURE) ×1
SUT VIC AB 3-0 CT1 TAPERPNT 27 (SUTURE) ×1 IMPLANT
SUT VIC AB 3-0 PS2 18 (SUTURE)
SUT VIC AB 3-0 PS2 18XBRD (SUTURE) IMPLANT
SUT VIC AB 3-0 SH 27 (SUTURE)
SUT VIC AB 3-0 SH 27X BRD (SUTURE) IMPLANT
TOWEL OR 17X24 6PK STRL BLUE (TOWEL DISPOSABLE) ×4 IMPLANT
TRAY FOLEY CATH 14FR (SET/KITS/TRAYS/PACK) ×2 IMPLANT
WATER STERILE IRR 1000ML POUR (IV SOLUTION) IMPLANT

## 2012-06-09 NOTE — Op Note (Addendum)
Patient Name: [name] MRN: [id number]  Date of Surgery: 06/09/2012    PREOPERATIVE DIAGNOSIS: Previous Cesarean Section  POSTOPERATIVE DIAGNOSIS: Preivous Cesarean Section   PROCEDURE: Low transverse cesarean section  SURGEON: Caralyn Guile. Arlyce Dice M.D.  ASSISTANT: Luvenia Redden, M.D.  ANESTHESIA: Spinal  ESTIMATED BLOOD LOSS: 800 ml  FINDINGS: Female, 7 lbs. 9 oz., Apgars 9,9; Clear Amniotic fluid, normal adnexa and uterus   INDICATIONS: This is a 36 y.o.  Gravida 4 Para 1021 who is admitted for repeat cesarean delivery.   PROCEDURE IN DETAIL: The patient was taken to the operating room and spinal anesthesia was placed.  She was then placed in the supine position with left lateral displacement of the uterus. The abdomen was prepped and draped in a sterile fashion and the bladder was catheterized.  A low transverse abdominal incision was made and carried down to the fascia. The fascia was opened transversely and the rectus sheath was dissected from the underlying rectus muscle. The rectus midline was identified and opened by sharp and blunt dissection. The peritoneum was opened. An Alexis retractor was placed and the lower uterine segment was identified, entered transversely by careful sharp dissection, and extended bluntly.  The infant was delivered with the aid of a vacuum extractor. The placenta was delivered with cord traction and uterine massage. The uterus was bluntly curettage. The lower segment was closed with running interlocking Vicryl 1 suture.  Hemostasis was obtained with vertical mattress sutures. The peritoneum and rectus muscle were closed in the midline with running 3-0 Vicryl suture. The fascia was closed with running 0 Vicryl suture.  The subcutaneous tissue was closed with 2-0 plain catgut suture and the skin was closed with staples. All sponge and instrument counts were correct.  The patient tolerated the procedure well and left the operating room in good condition.

## 2012-06-09 NOTE — Anesthesia Postprocedure Evaluation (Signed)
Anesthesia Post Note  Patient: [name]  Procedure(s) Performed: Procedure(s) (LRB): CESAREAN SECTION (N/A)  Anesthesia type: Spinal  Patient location: PACU  Post pain: Pain level controlled  Post assessment: Post-op Vital signs reviewed  Last Vitals:  Filed Vitals:   06/09/12 1915  BP: 119/66  Pulse: 60  Temp: 36.5 C  Resp: 19    Post vital signs: Reviewed  Level of consciousness: awake  Complications: No apparent anesthesia complications

## 2012-06-09 NOTE — Transfer of Care (Signed)
Immediate Anesthesia Transfer of Care Note  Patient: [name]  Procedure(s) Performed: Procedure(s) (LRB): CESAREAN SECTION (N/A)  Patient Location: PACU  Anesthesia Type: Spinal  Level of Consciousness: awake, alert  and oriented  Airway & Oxygen Therapy: Patient Spontanous Breathing  Post-op Assessment: Report given to PACU RN  Post vital signs: Reviewed and stable  Complications: No apparent anesthesia complications

## 2012-06-09 NOTE — H&P (Signed)
36 y.o. [id number]  Estimated Date of Delivery: 06/13/12 admitted at 39/[redacted] weeks gestation for repeat C/S.  Prenatal Transfer Tool  Maternal Diabetes: No Genetic Screening: Declined Maternal Ultrasounds/Referrals: Normal Fetal Ultrasounds or other Referrals:  None Maternal Substance Abuse:  No Significant Maternal Medications:  None Significant Maternal Lab Results: None Other Significant Pregnancy Complications:  Previous myomectomy, previous primary C/S.  Afebrile, VSS Heart and Lungs: No active disease Abdomen: soft, gravid, EFW AGA. Cervical exam:  Closed  Impression: Previous C/S because of history of cesarean.  Plan:  Repeat C/S.

## 2012-06-09 NOTE — Anesthesia Preprocedure Evaluation (Signed)
Anesthesia Evaluation  Patient identified by MRN, date of birth, ID band Patient awake    Reviewed: Allergy & Precautions, H&P , NPO status , Patient's Chart, lab work & pertinent test results, reviewed documented beta blocker date and time   History of Anesthesia Complications Negative for: history of anesthetic complications  Airway Mallampati: I TM Distance: >3 FB Neck ROM: full    Dental  (+) Teeth Intact   Pulmonary neg pulmonary ROS,  breath sounds clear to auscultation        Cardiovascular negative cardio ROS  Rhythm:regular Rate:Normal     Neuro/Psych negative neurological ROS  negative psych ROS   GI/Hepatic negative GI ROS, Neg liver ROS,   Endo/Other  Morbid obesity  Renal/GU negative Renal ROS  negative genitourinary   Musculoskeletal  (+) Arthritis - (knees, no meds), Osteoarthritis,    Abdominal   Peds  Hematology negative hematology ROS (+)   Anesthesia Other Findings   Reproductive/Obstetrics (+) Pregnancy (h/o c/s x1)                           Anesthesia Physical Anesthesia Plan  ASA: III  Anesthesia Plan: Spinal   Post-op Pain Management:    Induction:   Airway Management Planned:   Additional Equipment:   Intra-op Plan:   Post-operative Plan:   Informed Consent: I have reviewed the patients History and Physical, chart, labs and discussed the procedure including the risks, benefits and alternatives for the proposed anesthesia with the patient or authorized representative who has indicated his/her understanding and acceptance.     Plan Discussed with: Surgeon and CRNA  Anesthesia Plan Comments:         Anesthesia Quick Evaluation

## 2012-06-09 NOTE — Anesthesia Procedure Notes (Signed)
Spinal  Patient location during procedure: OR Start time: 06/09/2012 4:14 PM Staffing Performed by: anesthesiologist  Preanesthetic Checklist Completed: patient identified, site marked, surgical consent, pre-op evaluation, timeout performed, IV checked, risks and benefits discussed and monitors and equipment checked Spinal Block Patient position: sitting Prep: site prepped and draped and DuraPrep Patient monitoring: heart rate, continuous pulse ox and blood pressure Approach: midline Location: L3-4 Injection technique: single-shot Needle Needle type: Sprotte  Needle gauge: 24 G Needle length: 9 cm Assessment Sensory level: T4 Additional Notes Clear free flow CSF on first attempt.  No paresthesia.  Patient tolerated procedure well.  Jasmine December, MD

## 2012-06-10 ENCOUNTER — Encounter (HOSPITAL_COMMUNITY): Payer: Self-pay | Admitting: Obstetrics & Gynecology

## 2012-06-10 LAB — CBC
Hemoglobin: 9.2 g/dL — ABNORMAL LOW (ref 12.0–15.0)
MCH: 27.6 pg (ref 26.0–34.0)
MCHC: 32.1 g/dL (ref 30.0–36.0)
MCV: 86.2 fL (ref 78.0–100.0)

## 2012-06-10 NOTE — Progress Notes (Signed)
Post Op Day 1 Subjective: no complaints  Objective: Blood pressure 96/63, pulse 78, temperature 98.2 F (36.8 C),  breastfeeding.  Physical Exam:  General: alert Lochia: appropriate Uterine Fundus: firm Incision: no significant drainage   Basename 06/10/12 0515  HGB 9.2*  HCT 28.7*    Assessment/Plan: Doing well.  Continue post op observation.  Remove staples prior to discharge.   LOS: 1 day   [name] D 06/10/2012, 10:13 AM   .

## 2012-06-11 MED ORDER — OXYCODONE-ACETAMINOPHEN 5-325 MG PO TABS: 1.0000 | ORAL_TABLET | ORAL | Status: AC | PRN

## 2012-06-11 NOTE — Discharge Summary (Signed)
[name], [name]                ACCOUNT NO.:  [id number]  MEDICAL RECORD NO.:  [id number]  LOCATION:  9105                          FACILITY:  [facility]  PHYSICIAN:  Malva Limes, M.D.    DATE OF BIRTH:  [birth year]  DATE OF ADMISSION:  06/09/2012 DATE OF DISCHARGE:  06/11/2012                              DISCHARGE SUMMARY   PRINCIPAL DIAGNOSES: 1. Intrauterine pregnancy at term. 2. History of prior cesarean section. 3. Previous myomectomy.  PRINCIPAL PROCEDURES:  Repeat low-transverse cesarean section.  HISTORY OF PRESENT ILLNESS:  [name] is a 36 year old black female, G4, P1-0-2-1, who was admitted to [facility] by Dr. Ilda Mori on June 09, 2012, for repeat cesarean section.  The patient underwent this procedure on June 09, 2012, without complications.  A complete description of this procedure can be found in the dictated operative note.  The patient's postop course was benign.  Her postop hemoglobin was 9.2.  At the time of discharge, she was eating regular diet, ambulating without difficulty, and having adequate pain control.  The patient expressed her desire to go home on postop day #2.  She was discharged to home.  She was sent home with Percocet to take p.r.n.  She was instructed to follow up in the office in 4 weeks.  She was told to call the office with any temperature elevation or abnormal voiding.          ______________________________ Malva Limes, M.D.     MA/MEDQ  D:  06/11/2012  T:  06/11/2012  Job:  [id number]

## 2012-06-11 NOTE — Progress Notes (Signed)
POD#2 Pt without c/o. Tolerating diet VSSAF IMP/ Doing well Plan/ Discharge in am.

## 2012-06-11 NOTE — Progress Notes (Signed)
Called MD, patient requested early DC this am. Dr Dareen Piano was informed by the patient that she wanted to be DC earlier this AM, she said Dr Dareen Piano was not clear and said she could go home and her baby could stay, she reported that he did not perform a physical exam or talk to her about her care. About 1615 I noticed that there was a DC order and a RX for Percocet that was unsigned, along with orders to DC her staples. According to the patient he still had not been in the room to examine her. I Dr Dareen Piano and he said he would see her in about 4 hours [place]  He came in at 58 and signed her RX,. Patient reports he did not examine her staples or speak to her. Fanny Skates called MD and received an order for the patients staples to be removed in the office and to call for an appointment.patient was very upset and crying when she left.Marland Kitchen

## 2014-02-02 ENCOUNTER — Encounter: Payer: Self-pay | Admitting: Women's Health

## 2014-02-02 ENCOUNTER — Ambulatory Visit (INDEPENDENT_AMBULATORY_CARE_PROVIDER_SITE_OTHER): Payer: BC Managed Care – PPO | Admitting: Women's Health

## 2014-02-02 ENCOUNTER — Other Ambulatory Visit (HOSPITAL_COMMUNITY)
Admission: RE | Admit: 2014-02-02 | Discharge: 2014-02-02 | Disposition: A | Discharge status: Home / Self Care | Payer: BC Managed Care – PPO | Source: Ambulatory Visit | Attending: Women's Health | Admitting: Women's Health

## 2014-02-02 VITALS — BP 110/80 | Ht 66.0 in | Wt 244.0 lb

## 2014-02-02 DIAGNOSIS — Z1151 Encounter for screening for human papillomavirus (HPV): Secondary | ICD-10-CM | POA: Insufficient documentation

## 2014-02-02 DIAGNOSIS — D259 Leiomyoma of uterus, unspecified: Secondary | ICD-10-CM

## 2014-02-02 DIAGNOSIS — B9689 Other specified bacterial agents as the cause of diseases classified elsewhere: Secondary | ICD-10-CM

## 2014-02-02 DIAGNOSIS — D219 Benign neoplasm of connective and other soft tissue, unspecified: Secondary | ICD-10-CM | POA: Insufficient documentation

## 2014-02-02 DIAGNOSIS — A499 Bacterial infection, unspecified: Secondary | ICD-10-CM

## 2014-02-02 DIAGNOSIS — N760 Acute vaginitis: Secondary | ICD-10-CM

## 2014-02-02 DIAGNOSIS — N898 Other specified noninflammatory disorders of vagina: Secondary | ICD-10-CM

## 2014-02-02 DIAGNOSIS — Z01419 Encounter for gynecological examination (general) (routine) without abnormal findings: Secondary | ICD-10-CM | POA: Insufficient documentation

## 2014-02-02 LAB — WET PREP FOR TRICH, YEAST, CLUE
TRICH WET PREP: NONE SEEN
WBC, Wet Prep HPF POC: NONE SEEN
YEAST WET PREP: NONE SEEN

## 2014-02-02 MED ORDER — METRONIDAZOLE 0.75 % VA GEL
VAGINAL | Status: DC
Start: 2014-02-02 — End: 2014-06-28

## 2014-02-02 NOTE — Progress Notes (Signed)
[name] Nov 28, 1975 [id number]    History:    Presents for annual exam.  Mirena IUD placed 07/2012 with ultrasound confirming proper placement after when strings not visible. Myomectomy 2006, history of multiple small fibroids. C-section / daughter 05/2012/green [name]. Normal Pap history.  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 [name] 19 months, [name]  8, both doing well. 02/2011 missed AB with D&E.  ROS:  A  ROS was performed and pertinent positives and negatives are included.  Exam:  Filed Vitals:   02/02/14 1528  BP: 110/80    General appearance:  Normal Thyroid:  Symmetrical, normal in size, without palpable masses or nodularity. Respiratory  Auscultation:  Clear without wheezing or rhonchi Cardiovascular  Auscultation:  Regular rate, without rubs, murmurs or gallops  Edema/varicosities:  Not grossly evident Abdominal  Soft,nontender, without masses, guarding or rebound.  Liver/spleen:  No organomegaly noted  Hernia:  None appreciated  Skin  Inspection:  Grossly normal   Breasts: Examined lying and sitting.     Right: Without masses, retractions, discharge or axillary adenopathy.     Left: Without masses, retractions, discharge or axillary adenopathy. Gentitourinary   Inguinal/mons:  Normal without inguinal adenopathy  External genitalia:  Normal  BUS/Urethra/Skene's glands:  Normal  Vagina:  White adherent discharge with odor  - wet prep positive for amines, clues, and TNTC bacteria  Cervix:  Normal, IUD strings not visible  Uterus:   bulky.  Midline and mobile  Adnexa/parametria:     Rt: Without masses or tenderness.   Lt: Without masses or tenderness.  Anus and perineum: Normal  Digital rectal exam: Normal sphincter tone without palpated masses or tenderness  Assessment/Plan:  38 y.o. MBF G3P2 for annual exam.     Light monthly cycle Mirena 07/2012 Obesity Fibroid uterus Labs at primary care [facility]  Plan: SBE's, continue to increase regular exercise and decrease calories for weight loss. Working out at a gym several days per week. Calcium rich diet, vitamin D 1000 daily encouraged. Pap, new screening guidelines reviewed.last pap 2011. MetroGel vaginal cream 1 applicator at bedtime x5, alcohol precautions reviewed. Instructed to call if no relief of discharge.  [name], 4:45 PM 02/02/2014

## 2014-02-02 NOTE — Patient Instructions (Signed)

## 2014-02-08 ENCOUNTER — Encounter: Payer: Self-pay | Admitting: Obstetrics and Gynecology

## 2014-06-28 ENCOUNTER — Encounter: Payer: Self-pay | Admitting: Gynecology

## 2014-06-28 ENCOUNTER — Ambulatory Visit (INDEPENDENT_AMBULATORY_CARE_PROVIDER_SITE_OTHER): Payer: BC Managed Care – PPO | Admitting: Gynecology

## 2014-06-28 VITALS — BP 124/80

## 2014-06-28 DIAGNOSIS — N760 Acute vaginitis: Secondary | ICD-10-CM

## 2014-06-28 DIAGNOSIS — A499 Bacterial infection, unspecified: Secondary | ICD-10-CM

## 2014-06-28 DIAGNOSIS — N750 Cyst of Bartholin's gland: Secondary | ICD-10-CM

## 2014-06-28 DIAGNOSIS — N898 Other specified noninflammatory disorders of vagina: Secondary | ICD-10-CM

## 2014-06-28 DIAGNOSIS — B9689 Other specified bacterial agents as the cause of diseases classified elsewhere: Secondary | ICD-10-CM

## 2014-06-28 DIAGNOSIS — Z113 Encounter for screening for infections with a predominantly sexual mode of transmission: Secondary | ICD-10-CM

## 2014-06-28 LAB — WET PREP FOR TRICH, YEAST, CLUE
Trich, Wet Prep: NONE SEEN
YEAST WET PREP: NONE SEEN

## 2014-06-28 MED ORDER — CLINDAMYCIN PHOSPHATE 2 % VA CREA: 1.0000 | TOPICAL_CREAM | Freq: Every day | VAGINAL | Status: DC

## 2014-06-28 MED ORDER — CEPHALEXIN 500 MG PO CAPS: ORAL_CAPSULE | ORAL | Status: DC

## 2014-06-28 NOTE — Progress Notes (Signed)
   38 year old that presented to the office today complaining of a white like discharge with some odor. The patient has a Mirena IUD. Patient states that she is in a monogamous relationship. Patient was also concerned about a "lump" in the vaginal area that she noted a few days ago. She denied any dyspareunia.  Exam: Bartholin urethra Skene glands: There appears to be a small underlying right Bartholin's duct cyst nontender flocculent small. Vagina brownish like discharge with some odor Cervix: Same as above  GC and Chlamydia culture obtained results pending at time of this dictation Wet prep:  Amine: Positive Clue cells: Many WBC: Few Bacteria: Too numerous to count  Assessment/plan: #1 bacterial vaginosis will be treated with MetroGel vaginal cream to apply each bedtime for one week intravaginal. #2 small Bartholin's duct cyst questionable abscess we'll give a trial of Keflex 500 mg one by mouth twice a day for 10 days. Patient returned back to the office in 2 weeks for followup.

## 2014-06-28 NOTE — Addendum Note (Signed)
Addended by: Thurnell Garbe A on: 06/28/2014 02:13 PM   Modules accepted: Orders

## 2014-06-28 NOTE — Patient Instructions (Addendum)
Bartholin's Cyst or Abscess Bartholin's glands are small glands located within the folds of skin (labia) along the sides of the lower opening of the vagina (birth canal). A cyst may develop when the duct of the gland becomes blocked. When this happens, fluid that accumulates within the cyst can become infected. This is known as an abscess. The Bartholin gland produces a mucous fluid to lubricate the outside of the vagina during sexual intercourse. SYMPTOMS   Patients with a small cyst may not have any symptoms.  Mild discomfort to severe pain depending on the size of the cyst and if it is infected (abscess).  Pain, redness, and swelling around the lower opening of the vagina.  Painful intercourse.  Pressure in the perineal area.  Swelling of the lips of the vagina (labia).  The cyst or abscess can be on one side or both sides of the vagina. DIAGNOSIS   A large swelling is seen in the lower vagina area by your caregiver.  Painful to touch.  Redness and pain, if it is an abscess. TREATMENT   Sometimes the cyst will go away on its own.  Apply warm wet compresses to the area or take hot sitz baths several times a day.  An incision to drain the cyst or abscess with local anesthesia.  Culture the pus, if it is an abscess.  Antibiotic treatment, if it is an abscess.  Cut open the gland and suture the edges to make the opening of the gland bigger (marsupialization).  Remove the whole gland if the cyst or abscess returns. PREVENTION   Practice good hygiene.  Clean the vaginal area with a mild soap and soft cloth when bathing.  Do not rub hard in the vaginal area when bathing.  Protect the crotch area with a padded cushion if you take long bike rides or ride horses.  Be sure you are well lubricated when you have sexual intercourse. HOME CARE INSTRUCTIONS   If your cyst or abscess was opened, a small piece of gauze, or a drain, may have been placed in the wound to allow  drainage. Do not remove this gauze or drain unless directed by your caregiver.  Wear feminine pads, not tampons, as needed for any drainage or bleeding.  If antibiotics were prescribed, take them exactly as directed. Finish the entire course.  Only take over-the-counter or prescription medicines for pain, discomfort, or fever as directed by your caregiver. SEEK IMMEDIATE MEDICAL CARE IF:   You have an increase in pain, redness, swelling, or drainage.  You have bleeding from the wound which results in the use of more than the number of pads suggested by your caregiver in 24 hours.  You have chills.  You have a fever.  You develop any new problems (symptoms) or aggravation of your existing condition. MAKE SURE YOU:   Understand these instructions.  Will watch your condition.  Will get help right away if you are not doing well or get worse. Document Released: 11/05/2005 Document Revised: 01/28/2012 Document Reviewed: 06/23/2008 [facility] Patient Information 2015 [place], [place]. This information is not intended to replace advice given to you by your health care provider. Make sure you discuss any questions you have with your health care provider. Bacterial Vaginosis Bacterial vaginosis is a vaginal infection that occurs when the normal balance of bacteria in the vagina is disrupted. It results from an overgrowth of certain bacteria. This is the most common vaginal infection in women of childbearing age. Treatment is important to prevent  complications, especially in pregnant women, as it can cause a premature delivery. CAUSES  Bacterial vaginosis is caused by an increase in harmful bacteria that are normally present in smaller amounts in the vagina. Several different kinds of bacteria can cause bacterial vaginosis. However, the reason that the condition develops is not fully understood. RISK FACTORS Certain activities or behaviors can put you at an increased risk of developing bacterial  vaginosis, including:  Having a new sex partner or multiple sex partners.  Douching.  Using an intrauterine device (IUD) for contraception. Women do not get bacterial vaginosis from toilet seats, bedding, swimming pools, or contact with objects around them. SIGNS AND SYMPTOMS  Some women with bacterial vaginosis have no signs or symptoms. Common symptoms include:  Grey vaginal discharge.  A fishlike odor with discharge, especially after sexual intercourse.  Itching or burning of the vagina and vulva.  Burning or pain with urination. DIAGNOSIS  Your health care provider will take a medical history and examine the vagina for signs of bacterial vaginosis. A sample of vaginal fluid may be taken. Your health care provider will look at this sample under a microscope to check for bacteria and abnormal cells. A vaginal pH test may also be done.  TREATMENT  Bacterial vaginosis may be treated with antibiotic medicines. These may be given in the form of a pill or a vaginal cream. A second round of antibiotics may be prescribed if the condition comes back after treatment.  HOME CARE INSTRUCTIONS   Only take over-the-counter or prescription medicines as directed by your health care provider.  If antibiotic medicine was prescribed, take it as directed. Make sure you finish it even if you start to feel better.  Do not have sex until treatment is completed.  Tell all sexual partners that you have a vaginal infection. They should see their health care provider and be treated if they have problems, such as a mild rash or itching.  Practice safe sex by using condoms and only having one sex partner. SEEK MEDICAL CARE IF:   Your symptoms are not improving after 3 days of treatment.  You have increased discharge or pain.  You have a fever. MAKE SURE YOU:   Understand these instructions.  Will watch your condition.  Will get help right away if you are not doing well or get worse. FOR MORE  INFORMATION  Centers for Disease Control and Prevention, Division of STD Prevention: AppraiserFraud.fi American Sexual Health Association (ASHA): www.ashastd.org  Document Released: 11/05/2005 Document Revised: 08/26/2013 Document Reviewed: 06/17/2013 [facility] Patient Information 2015 [place], [place]. This information is not intended to replace advice given to you by your health care provider. Make sure you discuss any questions you have with your health care provider.

## 2014-06-29 LAB — GC/CHLAMYDIA PROBE AMP
CT Probe RNA: NEGATIVE
GC Probe RNA: NEGATIVE

## 2014-07-12 ENCOUNTER — Ambulatory Visit: Payer: BC Managed Care – PPO | Admitting: Gynecology

## 2014-07-20 ENCOUNTER — Ambulatory Visit (INDEPENDENT_AMBULATORY_CARE_PROVIDER_SITE_OTHER): Payer: BC Managed Care – PPO | Admitting: Gynecology

## 2014-07-20 ENCOUNTER — Encounter: Payer: Self-pay | Admitting: Gynecology

## 2014-07-20 VITALS — BP 124/78

## 2014-07-20 DIAGNOSIS — N898 Other specified noninflammatory disorders of vagina: Secondary | ICD-10-CM | POA: Insufficient documentation

## 2014-07-20 NOTE — Progress Notes (Signed)
   Patient presented to the office for 2 weeks followup. She was seen in the office on 06/28/2014 complaining of vaginal discharge and was treated for bacterial vaginosis with MetroGel vaginal cream at bedtime for one week. Patient also was concerned about a "lump" in the vaginal area that she noted a few days prior to that visit. It was a question of Bartholin duct cyst versus vaginal inclusion cyst and she was started on Keflex 500 mg twice a day for 10 days. She is asymptomatic today.  Exam: Bartholin urethra Skene glands within normal limits with the exception of the small right Bartholin's duct area cystic lesion has gotten smaller from previous exam. It was nontender and nonerythematous. The rest of her vaginal exam and bimanual exam was unremarkable.  Assessment/plan: A resolving right Bartholin duct cyst versus small inclusion cyst. Patient will do sitz baths 3 times a week. She will return back to the office in 6 months for followup. It becomes symptomatic or gets bigger she will return to the office for reassessment.

## 2014-09-20 ENCOUNTER — Encounter: Payer: Self-pay | Admitting: Gynecology

## 2014-10-27 ENCOUNTER — Ambulatory Visit: Payer: BC Managed Care – PPO | Admitting: Women's Health

## 2014-11-16 ENCOUNTER — Encounter: Payer: Self-pay | Admitting: Women's Health

## 2014-11-16 ENCOUNTER — Ambulatory Visit (INDEPENDENT_AMBULATORY_CARE_PROVIDER_SITE_OTHER): Payer: BC Managed Care – PPO | Admitting: Women's Health

## 2014-11-16 VITALS — BP 120/78 | Wt 264.0 lb

## 2014-11-16 DIAGNOSIS — Z833 Family history of diabetes mellitus: Secondary | ICD-10-CM

## 2014-11-16 DIAGNOSIS — E079 Disorder of thyroid, unspecified: Secondary | ICD-10-CM

## 2014-11-16 LAB — COMPREHENSIVE METABOLIC PANEL
ALK PHOS: 73 U/L (ref 39–117)
ALT: 17 U/L (ref 0–35)
AST: 21 U/L (ref 0–37)
Albumin: 3.8 g/dL (ref 3.5–5.2)
BUN: 13 mg/dL (ref 6–23)
CO2: 28 mEq/L (ref 19–32)
CREATININE: 0.92 mg/dL (ref 0.50–1.10)
Calcium: 8.6 mg/dL (ref 8.4–10.5)
Chloride: 103 mEq/L (ref 96–112)
Glucose, Bld: 69 mg/dL — ABNORMAL LOW (ref 70–99)
Potassium: 3.9 mEq/L (ref 3.5–5.3)
SODIUM: 136 meq/L (ref 135–145)
Total Bilirubin: 0.4 mg/dL (ref 0.2–1.2)
Total Protein: 6.5 g/dL (ref 6.0–8.3)

## 2014-11-16 LAB — HEMOGLOBIN A1C
HEMOGLOBIN A1C: 5.3 % (ref <5.7)
MEAN PLASMA GLUCOSE: 105 mg/dL (ref <117)

## 2014-11-16 NOTE — Patient Instructions (Signed)

## 2014-11-16 NOTE — Progress Notes (Signed)
Patient ID: [name], female   DOB: 07/14/1976, 38 y.o.   MRN: [id number] Presents with complaint of difficulty losing weight despite regular exercise, low carb diet. 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 Questions if she has a hormonal imbalance. Denies difficulty conceiving, monthly cycle prior to [place].  Contraceptives with Mirena IUD placed 07/2012 with rare bleeding. Reports both parents obese. Denies history of GDM.  Exam: Appears well, obese. No visible facial hair.  Morbid obesity  Name: Congratulated on regular exercise, continue low carb diet, Weight Watchers, weight loss surgery discussed. TSH, hemoglobin A1c, CMP.

## 2014-11-17 LAB — TSH: TSH: 2.488 u[IU]/mL (ref 0.350–4.500)

## 2015-01-19 ENCOUNTER — Other Ambulatory Visit: Payer: Self-pay | Admitting: Gynecology

## 2015-01-19 ENCOUNTER — Ambulatory Visit (INDEPENDENT_AMBULATORY_CARE_PROVIDER_SITE_OTHER): Payer: BC Managed Care – PPO

## 2015-01-19 ENCOUNTER — Ambulatory Visit (INDEPENDENT_AMBULATORY_CARE_PROVIDER_SITE_OTHER): Payer: BC Managed Care – PPO | Admitting: Gynecology

## 2015-01-19 ENCOUNTER — Encounter: Payer: Self-pay | Admitting: Gynecology

## 2015-01-19 VITALS — BP 116/72 | Ht 65.5 in | Wt 255.0 lb

## 2015-01-19 DIAGNOSIS — Z30431 Encounter for routine checking of intrauterine contraceptive device: Secondary | ICD-10-CM

## 2015-01-19 DIAGNOSIS — N83201 Unspecified ovarian cyst, right side: Secondary | ICD-10-CM

## 2015-01-19 DIAGNOSIS — Z01419 Encounter for gynecological examination (general) (routine) without abnormal findings: Secondary | ICD-10-CM

## 2015-01-19 DIAGNOSIS — R102 Pelvic and perineal pain: Secondary | ICD-10-CM

## 2015-01-19 DIAGNOSIS — N8320 Unspecified ovarian cysts: Secondary | ICD-10-CM

## 2015-01-19 DIAGNOSIS — R635 Abnormal weight gain: Secondary | ICD-10-CM | POA: Diagnosis not present

## 2015-01-19 DIAGNOSIS — N949 Unspecified condition associated with female genital organs and menstrual cycle: Secondary | ICD-10-CM | POA: Diagnosis not present

## 2015-01-19 NOTE — Progress Notes (Signed)
[name] 1976-05-04 [id number]   History:    39 y.o.  for annual gyn exam with complaint of weight gain since she's had her Mirena IUD placed in 2013. Review of patient's records indicated that in 2006 she had a myomectomy. Patient has complained of weight gain and several years ago she had been on phentermine for several months. Patient denied any previous history of any abnormal Pap smears. Dr. Norma Fredrickson her PCP who is been doing her blood work. Vaccines are up-to-date.  Past medical history,surgical history, family history and social history were all reviewed and documented in the EPIC chart.  Gynecologic History Patient's last menstrual period was 01/05/2015. Contraception: IUD Last Pap: 2015. Results were: normal Last mammogram: Not indicated. Results were: Not indicated  Obstetric History OB History  Gravida Para Term Preterm AB SAB TAB Ectopic Multiple Living  4 2 2  2 1 1   2     # Outcome Date GA Lbr Len/2nd Weight Sex Delivery Anes PTL Lv  4 Term 06/09/12 [redacted]w[redacted]d  7 lb 8.8 oz (3.425 kg) F CS-LTranv Spinal  Y  3 SAB 02/2011          2 Term 04/2005 [redacted]w[redacted]d  8 lb (3.629 kg) M CS-LTranv EPI  Y  1 TAB 07/2001               ROS: A ROS was performed and pertinent positives and negatives are included in the history.  GENERAL: No fevers or chills. HEENT: No change in vision, no earache, sore throat or sinus congestion. NECK: No pain or stiffness. CARDIOVASCULAR: No chest pain or pressure. No palpitations. PULMONARY: No shortness of breath, cough or wheeze. GASTROINTESTINAL: No abdominal pain, nausea, vomiting or diarrhea, melena or bright red blood per rectum. GENITOURINARY: No urinary frequency, urgency, hesitancy or dysuria. MUSCULOSKELETAL: No joint or muscle pain, no back pain, no recent trauma. DERMATOLOGIC: No rash, no itching, no lesions. ENDOCRINE: No polyuria, polydipsia, no heat or cold intolerance. No recent change in weight. HEMATOLOGICAL: No anemia or easy bruising or  bleeding. NEUROLOGIC: No headache, seizures, numbness, tingling or weakness. PSYCHIATRIC: No depression, no loss of interest in normal activity or change in sleep pattern.     Exam: chaperone present  BP 116/72 mmHg  Ht 5' 5.5" (1.664 m)  Wt 255 lb (115.667 kg)  BMI 41.77 kg/m2  LMP 01/05/2015  Body mass index is 41.77 kg/(m^2).  General appearance : Well developed well nourished female. No acute distress HEENT: Eyes: no retinal hemorrhage or exudates,  Neck supple, trachea midline, no carotid bruits, no thyroidmegaly Lungs: Clear to auscultation, no rhonchi or wheezes, or rib retractions  Heart: Regular rate and rhythm, no murmurs or gallops Breast:Examined in sitting and supine position were symmetrical in appearance, no palpable masses or tenderness,  no skin retraction, no nipple inversion, no nipple discharge, no skin discoloration, no axillary or supraclavicular lymphadenopathy Abdomen: no palpable masses or tenderness, no rebound or guarding Extremities: no edema or skin discoloration or tenderness  Pelvic:  Bartholin, Urethra, Skene Glands: Within normal limits             Vagina: No gross lesions or discharge  Cervix: No gross lesions or discharge, IUD string not visualized  Uterus  anteverted, normal size, shape and consistency, non-tender and mobile  Adnexa  right adnexal fullness  Anus and perineum  normal   Rectovaginal  normal sphincter tone without palpated masses or tenderness  Hemoccult not indicated   An ultrasound was ordered today due to the fact the IUD string was not seen. Ultrasound results as follows: Uterus measured 9.0 x 6.7 x 5.3 cm with endometrial stripe of 3.5 mm. Patient had several fibroids the largest one measured 18 x 15 mm intramural. IUD was seen in the normal position. The rim of the right ovary was a thinwall cyst with reticular echo pattern measuring 4.8 x 3.5 x 4.9 cm average size 4.4 cm and was avascular. Arterial blood flow was  seen on the right and left ovary. No fluid in the cul-de-sac    Assessment/Plan:  39 y.o. female for annual exam who would like to switch her IUD to a nonhormonal IUD because of her weight gain. She will make arrangements the next few weeks to change the Mirena IUD to a [place] IUD. Pap smear not indicated until next year. Her PCP has been doing her blood work. We discussed importance of monthly breast exam. We discussed importance of healthy nutrition and regular exercise. Patient with right ovarian cysts we'll follow-up with ultrasound in 2 months. We had discussed a CA 125 and its limitations especially falsely elevated when there is fibroids like in her case. I have recommended that once we switch her IUD to the [facility] nonhormonal IUD that we repeat the ultrasound in 2 months if the cyst is still present then we'll do this CA 125 and consider laparoscopic cystectomy. Patient otherwise has been asymptomatic and literature information was provided.   Terrance Mass MD, 4:13 PM 01/19/2015

## 2015-01-19 NOTE — Patient Instructions (Addendum)
Intrauterine Device Information An intrauterine device (IUD) is inserted into your uterus to prevent pregnancy. There are two types of IUDs available:   Copper IUD--This type of IUD is wrapped in copper wire and is placed inside the uterus. Copper makes the uterus and fallopian tubes produce a fluid that kills sperm. The copper IUD can stay in place for 10 years.  Hormone IUD--This type of IUD contains the hormone progestin (synthetic progesterone). The hormone thickens the cervical mucus and prevents sperm from entering the uterus. It also thins the uterine lining to prevent implantation of a fertilized egg. The hormone can weaken or kill the sperm that get into the uterus. One type of hormone IUD can stay in place for 5 years, and another type can stay in place for 3 years. Your health care provider will make sure you are a good candidate for a contraceptive IUD. Discuss with your health care provider the possible side effects.  ADVANTAGES OF AN INTRAUTERINE DEVICE  IUDs are highly effective, reversible, long acting, and low maintenance.   There are no estrogen-related side effects.   An IUD can be used when breastfeeding.   IUDs are not associated with weight gain.   The copper IUD works immediately after insertion.   The hormone IUD works right away if inserted within 7 days of your period starting. You will need to use a backup method of birth control for 7 days if the hormone IUD is inserted at any other time in your cycle.  The copper IUD does not interfere with your female hormones.   The hormone IUD can make heavy menstrual periods lighter and decrease cramping.   The hormone IUD can be used for 3 or 5 years.   The copper IUD can be used for 10 years. DISADVANTAGES OF AN INTRAUTERINE DEVICE  The hormone IUD can be associated with irregular bleeding patterns.   The copper IUD can make your menstrual flow heavier and more painful.   You may experience cramping and  vaginal bleeding after insertion.  Document Released: 10/09/2004 Document Revised: 07/08/2013 Document Reviewed: 04/26/2013 [facility] Patient Information 2015 [place], [place]. This information is not intended to replace advice given to you by your health care provider. Make sure you discuss any questions you have with your health care provider. Transvaginal Ultrasound Transvaginal ultrasound is a pelvic ultrasound, using a metal probe that is placed in the vagina, to look at a women's female organs. Transvaginal ultrasound is a method of seeing inside the pelvis of a woman. The ultrasound machine sends out sound waves from the transducer (probe). These sound waves bounce off body structures (like an echo) to create a picture. The picture shows up on a monitor. It is called transvaginal because the probe is inserted into the vagina. There should be very little discomfort from the vaginal probe. This test can also be used during pregnancy. Endovaginal ultrasound is another name for a transvaginal ultrasound. In a transabdominal ultrasound, the probe is placed on the outside of the belly. This method gives pictures that are lower quality than pictures from the transvaginal technique. Transvaginal ultrasound is used to look for problems of the female genital tract. Some such problems include:  Infertility problems.  Congenital (birth defect) malformations of the uterus and ovaries.  Tumors in the uterus.  Abnormal bleeding.  Ovarian tumors and cysts.  Abscess (inflamed tissue around pus) in the pelvis.  Unexplained abdominal or pelvic pain.  Pelvic infection. DURING PREGNANCY, TRANSVAGINAL ULTRASOUND MAY BE USED TO  LOOK AT:  Normal pregnancy.  Ectopic pregnancy (pregnancy outside the uterus).  Fetal heartbeat.  Abnormalities in the pelvis, that are not seen well with transabdominal ultrasound.  Suspected twins or multiples.  Impending miscarriage.  Problems with the cervix  (incompetent cervix, not able to stay closed and hold the baby).  When doing an amniocentesis (removing fluid from the pregnancy sac, for testing).  Looking for abnormalities of the baby.  Checking the growth, development, and age of the fetus.  Measuring the amount of fluid in the amniotic sac.  When doing an external version of the baby (moving baby into correct position).  Evaluating the baby for problems in high risk pregnancies (biophysical profile).  Suspected fetal demise (death). Sometimes a special ultrasound method called Saline Infusion Sonography (SIS) is used for a more accurate look at the uterus. Sterile saline (salt water) is injected into the uterus of non-pregnant patients to see the inside of the uterus better. SIS is not used on pregnant women. The vaginal probe can also assist in obtaining biopsies of abnormal areas, in draining fluid from cysts on the ovary, and in finding IUDs (intrauterine device, birth control) that cannot be located. PREPARATION FOR TEST A transvaginal ultrasound is done with the bladder empty. The transabdominal ultrasound is done with your bladder full. You may be asked to drink several glasses of water before that exam. Sometimes, a transabdominal ultrasound is done just after a transvaginal ultrasound, to look at organs in your abdomen. PROCEDURE  You will lie down on a table, with your knees bent and your feet in foot holders. The probe is covered with a condom. A sterile lubricant is put into the vagina and on the probe. The lubricant helps transmit the sound waves and avoid irritating the vagina. Your caregiver will move the probe inside the vaginal cavity to scan the pelvic structures. A normal test will show a normal pelvis and normal contents. An abnormal test will show abnormalities of the pelvis, placenta, or baby. ABNORMAL RESULTS MAY BE DUE TO:  Growths or tumors in the:  Uterus.  Ovaries.  Vagina.  Other pelvic  structures.  Non-cancerous growths of the uterus and ovaries.  Twisting of the ovary, cutting off blood supply to the ovary (ovarian torsion).  Areas of infection, including:  Pelvic inflammatory disease.  Abscess in the pelvis.  Locating an IUD. PROBLEMS FOUND IN PREGNANT WOMEN MAY INCLUDE:  Ectopic pregnancy (pregnancy outside the uterus).  Multiple pregnancies.  Early dilation (opening) of the cervix. This may indicate an incompetent cervix and early delivery.  Impending miscarriage.  Fetal death.  Problems with the placenta, including:  Placenta has grown over the opening of the womb (placenta previa).  Placenta has separated early in the womb (placental abruption).  Placenta grows into the muscle of the uterus (placenta accreta).  Tumors of pregnancy, including gestational trophoblastic disease. This is an abnormal pregnancy, with no fetus. The uterus is filled with many grape-like cysts that could sometimes be cancerous.  Incorrect position of the fetus (breech, vertex).  Intrauterine fetal growth retardation (IUGR) (poor growth in the womb).  Fetal abnormalities or infection. RISKS AND COMPLICATIONS There are no known risks to the ultrasound procedure. There is no X-ray used when doing an ultrasound. Document Released: 10/17/2004 Document Revised: 01/28/2012 Document Reviewed: 10/05/2009 [facility] Patient Information 2015 [place], [place]. This information is not intended to replace advice given to you by your health care provider. Make sure you discuss any questions you have with your health  care provider. Ovarian Cyst An ovarian cyst is a fluid-filled sac that forms on an ovary. The ovaries are small organs that produce eggs in women. Various types of cysts can form on the ovaries. Most are not cancerous. Many do not cause problems, and they often go away on their own. Some may cause symptoms and require treatment. Common types of ovarian cysts  include:  Functional cysts--These cysts may occur every month during the menstrual cycle. This is normal. The cysts usually go away with the next menstrual cycle if the woman does not get pregnant. Usually, there are no symptoms with a functional cyst.  Endometrioma cysts--These cysts form from the tissue that lines the uterus. They are also called "chocolate cysts" because they become filled with blood that turns brown. This type of cyst can cause pain in the lower abdomen during intercourse and with your menstrual period.  Cystadenoma cysts--This type develops from the cells on the outside of the ovary. These cysts can get very big and cause lower abdomen pain and pain with intercourse. This type of cyst can twist on itself, cut off its blood supply, and cause severe pain. It can also easily rupture and cause a lot of pain.  Dermoid cysts--This type of cyst is sometimes found in both ovaries. These cysts may contain different kinds of body tissue, such as skin, teeth, hair, or cartilage. They usually do not cause symptoms unless they get very big.  Theca lutein cysts--These cysts occur when too much of a certain hormone (human chorionic gonadotropin) is produced and overstimulates the ovaries to produce an egg. This is most common after procedures used to assist with the conception of a baby (in vitro fertilization). CAUSES   Fertility drugs can cause a condition in which multiple large cysts are formed on the ovaries. This is called ovarian hyperstimulation syndrome.  A condition called polycystic ovary syndrome can cause hormonal imbalances that can lead to nonfunctional ovarian cysts. SIGNS AND SYMPTOMS  Many ovarian cysts do not cause symptoms. If symptoms are present, they may include:  Pelvic pain or pressure.  Pain in the lower abdomen.  Pain during sexual intercourse.  Increasing girth (swelling) of the abdomen.  Abnormal menstrual periods.  Increasing pain with menstrual  periods.  Stopping having menstrual periods without being pregnant. DIAGNOSIS  These cysts are commonly found during a routine or annual pelvic exam. Tests may be ordered to find out more about the cyst. These tests may include:  Ultrasound.  X-ray of the pelvis.  CT scan.  MRI.  Blood tests. TREATMENT  Many ovarian cysts go away on their own without treatment. Your health care provider may want to check your cyst regularly for 2-3 months to see if it changes. For women in menopause, it is particularly important to monitor a cyst closely because of the higher rate of ovarian cancer in menopausal women. When treatment is needed, it may include any of the following:  A procedure to drain the cyst (aspiration). This may be done using a long needle and ultrasound. It can also be done through a laparoscopic procedure. This involves using a thin, lighted tube with a tiny camera on the end (laparoscope) inserted through a small incision.  Surgery to remove the whole cyst. This may be done using laparoscopic surgery or an open surgery involving a larger incision in the lower abdomen.  Hormone treatment or birth control pills. These methods are sometimes used to help dissolve a cyst. HOME CARE INSTRUCTIONS  Only take over-the-counter or prescription medicines as directed by your health care provider.  Follow up with your health care provider as directed.  Get regular pelvic exams and Pap tests. SEEK MEDICAL CARE IF:   Your periods are late, irregular, or painful, or they stop.  Your pelvic pain or abdominal pain does not go away.  Your abdomen becomes larger or swollen.  You have pressure on your bladder or trouble emptying your bladder completely.  You have pain during sexual intercourse.  You have feelings of fullness, pressure, or discomfort in your stomach.  You lose weight for no apparent reason.  You feel generally ill.  You become constipated.  You lose your  appetite.  You develop acne.  You have an increase in body and facial hair.  You are gaining weight, without changing your exercise and eating habits.  You think you are pregnant. SEEK IMMEDIATE MEDICAL CARE IF:   You have increasing abdominal pain.  You feel sick to your stomach (nauseous), and you throw up (vomit).  You develop a fever that comes on suddenly.  You have abdominal pain during a bowel movement.  Your menstrual periods become heavier than usual. MAKE SURE YOU:  Understand these instructions.  Will watch your condition.  Will get help right away if you are not doing well or get worse. Document Released: 11/05/2005 Document Revised: 11/10/2013 Document Reviewed: 07/13/2013 [facility] Patient Information 2015 [place], [place]. This information is not intended to replace advice given to you by your health care provider. Make sure you discuss any questions you have with your health care provider.

## 2015-01-25 ENCOUNTER — Encounter: Payer: Self-pay | Admitting: Gynecology

## 2015-01-25 ENCOUNTER — Ambulatory Visit (INDEPENDENT_AMBULATORY_CARE_PROVIDER_SITE_OTHER): Payer: BC Managed Care – PPO | Admitting: Gynecology

## 2015-01-25 VITALS — BP 130/84

## 2015-01-25 DIAGNOSIS — Z30433 Encounter for removal and reinsertion of intrauterine contraceptive device: Secondary | ICD-10-CM

## 2015-01-25 DIAGNOSIS — Z975 Presence of (intrauterine) contraceptive device: Secondary | ICD-10-CM | POA: Insufficient documentation

## 2015-01-25 NOTE — Progress Notes (Signed)
   Patient presented to the office today to have her Mirena IUD removed a was placed in another facility in 2013. She has tried everything possible to lose weight and wanted to have the IUD removed and replaced with a nonhormonal IUD such as the [place] IUD. At time of her recent office visit for annual exam the IUD string was not visualized and ultrasound was done and and it did confirm the IUD was in the proper position. She was found incidentally to have a right ovarian cyst measuring 4.8 x 3.5 x 4.9 cm average size 4.4 cm was avascular thin wall with some reticular pattern. Patient opted not to have a CA 125 but to return back in 2 months for follow-up ultrasound.  Pelvic exam: Bartholin urethra Skene was within normal limits Vagina: No lesions or discharge Cervix: No lesions or discharge IUD since string not seen Bimanual exam: Uterus anteverted normal size shape and consistency Adnexa: No palpable masses or tenderness Rectal exam not done  The cervix was cleansed with Betadine solution. A CO2 tenaculum was placed on the anterior cervical lip. With a small hole introduced into the uterine cavity the IUD string was pulled and retrieved in the IUD was shown to the patient discarded. The cervix was once again cleansed with benign solution for placement of the ParaGard T380A IUD as follows:                                                                    IUD procedure note       Patient presented to the office today for placement of ParaGard IUD. The patient had previously been provided with literature information on this method of contraception. The risks benefits and pros and cons were discussed and all her questions were answered. She is fully aware that this form of contraception is 99% effective and is good for 10 years.  Pelvic exam: Bartholin urethra Skene glands: Within normal limits Vagina: No lesions or discharge Cervix: No lesions or discharge Uterus: Anteverted  position Adnexa: No masses or tenderness Rectal exam: Not done  The cervix was cleansed with Betadine solution. A single-tooth tenaculum was placed on the anterior cervical lip. The uterus sounded to 8 centimeter. The IUD was shown to the patient and inserted in a sterile fashion. The IUD string was trimmed. The single-tooth tenaculum was removed. Patient was instructed to return back to the office in one month for follow up.      ParaGard T380A IUD was placed 01/25/2015 good for 10 years. Lot #[id number]

## 2015-01-25 NOTE — Patient Instructions (Signed)

## 2015-01-26 ENCOUNTER — Ambulatory Visit: Payer: BC Managed Care – PPO | Admitting: Gynecology

## 2015-01-26 ENCOUNTER — Encounter: Payer: Self-pay | Admitting: Gynecology

## 2015-01-27 ENCOUNTER — Ambulatory Visit: Payer: BC Managed Care – PPO | Admitting: Gynecology

## 2015-02-07 ENCOUNTER — Other Ambulatory Visit: Payer: Self-pay

## 2015-03-15 ENCOUNTER — Ambulatory Visit (INDEPENDENT_AMBULATORY_CARE_PROVIDER_SITE_OTHER): Payer: BC Managed Care – PPO | Admitting: Gynecology

## 2015-03-15 ENCOUNTER — Encounter: Payer: Self-pay | Admitting: Gynecology

## 2015-03-15 VITALS — BP 126/78

## 2015-03-15 DIAGNOSIS — Z30431 Encounter for routine checking of intrauterine contraceptive device: Secondary | ICD-10-CM | POA: Diagnosis not present

## 2015-03-15 DIAGNOSIS — N8320 Unspecified ovarian cysts: Secondary | ICD-10-CM | POA: Diagnosis not present

## 2015-03-15 DIAGNOSIS — N83201 Unspecified ovarian cyst, right side: Secondary | ICD-10-CM

## 2015-03-15 NOTE — Progress Notes (Signed)
   Patient presented to the office today for her 1 month follow-up after having placed the ParaGard T380A IUD. An ultrasound that had been done prior to remove the IUD since the string could not be seen vaginally had demonstrated a right ovarian cyst measuring 4.8 x 3.5 x 4.9 cm average size 4.4 cm was avascular thin wall with some reticular pattern. Patient opted not to have a CA 125 but to return back in 2 months for follow-up ultrasound. She is otherwise asymptomatic today.  Exam: Blood pressure 126/78 Gen. appearance: Well-developed well-nourished female no acute distress Pelvic: Bartholin urethra Skene was within normal limits Vagina: No lesions or discharge Cervix: IUD string was visualized the string was trimmed Uterus: Anteverted normal size shape and consistency Adnexa: No palpable mass or tenderness Rectal exam: Not done  Assessment/plan: #1 one month status post placement of ParaGard T380A IUD doing well #2 incidental finding on ultrasound last month of a right ovarian cyst patient to schedule a follow-up ultrasound here in the office in 2 weeks. No adnexal masses palpated during bimanual exam.

## 2015-03-15 NOTE — Patient Instructions (Signed)
Transvaginal Ultrasound Transvaginal ultrasound is a pelvic ultrasound, using a metal probe that is placed in the vagina, to look at a women's female organs. Transvaginal ultrasound is a method of seeing inside the pelvis of a woman. The ultrasound machine sends out sound waves from the transducer (probe). These sound waves bounce off body structures (like an echo) to create a picture. The picture shows up on a monitor. It is called transvaginal because the probe is inserted into the vagina. There should be very little discomfort from the vaginal probe. This test can also be used during pregnancy. Endovaginal ultrasound is another name for a transvaginal ultrasound. In a transabdominal ultrasound, the probe is placed on the outside of the belly. This method gives pictures that are lower quality than pictures from the transvaginal technique. Transvaginal ultrasound is used to look for problems of the female genital tract. Some such problems include:  Infertility problems.  Congenital (birth defect) malformations of the uterus and ovaries.  Tumors in the uterus.  Abnormal bleeding.  Ovarian tumors and cysts.  Abscess (inflamed tissue around pus) in the pelvis.  Unexplained abdominal or pelvic pain.  Pelvic infection. DURING PREGNANCY, TRANSVAGINAL ULTRASOUND MAY BE USED TO LOOK AT:  Normal pregnancy.  Ectopic pregnancy (pregnancy outside the uterus).  Fetal heartbeat.  Abnormalities in the pelvis, that are not seen well with transabdominal ultrasound.  Suspected twins or multiples.  Impending miscarriage.  Problems with the cervix (incompetent cervix, not able to stay closed and hold the baby).  When doing an amniocentesis (removing fluid from the pregnancy sac, for testing).  Looking for abnormalities of the baby.  Checking the growth, development, and age of the fetus.  Measuring the amount of fluid in the amniotic sac.  When doing an external version of the baby (moving  baby into correct position).  Evaluating the baby for problems in high risk pregnancies (biophysical profile).  Suspected fetal demise (death). Sometimes a special ultrasound method called Saline Infusion Sonography (SIS) is used for a more accurate look at the uterus. Sterile saline (salt water) is injected into the uterus of non-pregnant patients to see the inside of the uterus better. SIS is not used on pregnant women. The vaginal probe can also assist in obtaining biopsies of abnormal areas, in draining fluid from cysts on the ovary, and in finding IUDs (intrauterine device, birth control) that cannot be located. PREPARATION FOR TEST A transvaginal ultrasound is done with the bladder empty. The transabdominal ultrasound is done with your bladder full. You may be asked to drink several glasses of water before that exam. Sometimes, a transabdominal ultrasound is done just after a transvaginal ultrasound, to look at organs in your abdomen. PROCEDURE  You will lie down on a table, with your knees bent and your feet in foot holders. The probe is covered with a condom. A sterile lubricant is put into the vagina and on the probe. The lubricant helps transmit the sound waves and avoid irritating the vagina. Your caregiver will move the probe inside the vaginal cavity to scan the pelvic structures. A normal test will show a normal pelvis and normal contents. An abnormal test will show abnormalities of the pelvis, placenta, or baby. ABNORMAL RESULTS MAY BE DUE TO:  Growths or tumors in the:  Uterus.  Ovaries.  Vagina.  Other pelvic structures.  Non-cancerous growths of the uterus and ovaries.  Twisting of the ovary, cutting off blood supply to the ovary (ovarian torsion).  Areas of infection, including:  Pelvic  inflammatory disease.  Abscess in the pelvis.  Locating an IUD. PROBLEMS FOUND IN PREGNANT WOMEN MAY INCLUDE:  Ectopic pregnancy (pregnancy outside the uterus).  Multiple  pregnancies.  Early dilation (opening) of the cervix. This may indicate an incompetent cervix and early delivery.  Impending miscarriage.  Fetal death.  Problems with the placenta, including:  Placenta has grown over the opening of the womb (placenta previa).  Placenta has separated early in the womb (placental abruption).  Placenta grows into the muscle of the uterus (placenta accreta).  Tumors of pregnancy, including gestational trophoblastic disease. This is an abnormal pregnancy, with no fetus. The uterus is filled with many grape-like cysts that could sometimes be cancerous.  Incorrect position of the fetus (breech, vertex).  Intrauterine fetal growth retardation (IUGR) (poor growth in the womb).  Fetal abnormalities or infection. RISKS AND COMPLICATIONS There are no known risks to the ultrasound procedure. There is no X-ray used when doing an ultrasound. Document Released: 10/17/2004 Document Revised: 01/28/2012 Document Reviewed: 10/05/2009 [facility] Patient Information 2015 [place], [place]. This information is not intended to replace advice given to you by your health care provider. Make sure you discuss any questions you have with your health care provider. Ovarian Cyst An ovarian cyst is a fluid-filled sac that forms on an ovary. The ovaries are small organs that produce eggs in women. Various types of cysts can form on the ovaries. Most are not cancerous. Many do not cause problems, and they often go away on their own. Some may cause symptoms and require treatment. Common types of ovarian cysts include:  Functional cysts--These cysts may occur every month during the menstrual cycle. This is normal. The cysts usually go away with the next menstrual cycle if the woman does not get pregnant. Usually, there are no symptoms with a functional cyst.  Endometrioma cysts--These cysts form from the tissue that lines the uterus. They are also called "chocolate cysts" because they  become filled with blood that turns brown. This type of cyst can cause pain in the lower abdomen during intercourse and with your menstrual period.  Cystadenoma cysts--This type develops from the cells on the outside of the ovary. These cysts can get very big and cause lower abdomen pain and pain with intercourse. This type of cyst can twist on itself, cut off its blood supply, and cause severe pain. It can also easily rupture and cause a lot of pain.  Dermoid cysts--This type of cyst is sometimes found in both ovaries. These cysts may contain different kinds of body tissue, such as skin, teeth, hair, or cartilage. They usually do not cause symptoms unless they get very big.  Theca lutein cysts--These cysts occur when too much of a certain hormone (human chorionic gonadotropin) is produced and overstimulates the ovaries to produce an egg. This is most common after procedures used to assist with the conception of a baby (in vitro fertilization). CAUSES   Fertility drugs can cause a condition in which multiple large cysts are formed on the ovaries. This is called ovarian hyperstimulation syndrome.  A condition called polycystic ovary syndrome can cause hormonal imbalances that can lead to nonfunctional ovarian cysts. SIGNS AND SYMPTOMS  Many ovarian cysts do not cause symptoms. If symptoms are present, they may include:  Pelvic pain or pressure.  Pain in the lower abdomen.  Pain during sexual intercourse.  Increasing girth (swelling) of the abdomen.  Abnormal menstrual periods.  Increasing pain with menstrual periods.  Stopping having menstrual periods without being pregnant.  DIAGNOSIS  These cysts are commonly found during a routine or annual pelvic exam. Tests may be ordered to find out more about the cyst. These tests may include:  Ultrasound.  X-ray of the pelvis.  CT scan.  MRI.  Blood tests. TREATMENT  Many ovarian cysts go away on their own without treatment. Your health  care provider may want to check your cyst regularly for 2-3 months to see if it changes. For women in menopause, it is particularly important to monitor a cyst closely because of the higher rate of ovarian cancer in menopausal women. When treatment is needed, it may include any of the following:  A procedure to drain the cyst (aspiration). This may be done using a long needle and ultrasound. It can also be done through a laparoscopic procedure. This involves using a thin, lighted tube with a tiny camera on the end (laparoscope) inserted through a small incision.  Surgery to remove the whole cyst. This may be done using laparoscopic surgery or an open surgery involving a larger incision in the lower abdomen.  Hormone treatment or birth control pills. These methods are sometimes used to help dissolve a cyst. HOME CARE INSTRUCTIONS   Only take over-the-counter or prescription medicines as directed by your health care provider.  Follow up with your health care provider as directed.  Get regular pelvic exams and Pap tests. SEEK MEDICAL CARE IF:   Your periods are late, irregular, or painful, or they stop.  Your pelvic pain or abdominal pain does not go away.  Your abdomen becomes larger or swollen.  You have pressure on your bladder or trouble emptying your bladder completely.  You have pain during sexual intercourse.  You have feelings of fullness, pressure, or discomfort in your stomach.  You lose weight for no apparent reason.  You feel generally ill.  You become constipated.  You lose your appetite.  You develop acne.  You have an increase in body and facial hair.  You are gaining weight, without changing your exercise and eating habits.  You think you are pregnant. SEEK IMMEDIATE MEDICAL CARE IF:   You have increasing abdominal pain.  You feel sick to your stomach (nauseous), and you throw up (vomit).  You develop a fever that comes on suddenly.  You have abdominal  pain during a bowel movement.  Your menstrual periods become heavier than usual. MAKE SURE YOU:  Understand these instructions.  Will watch your condition.  Will get help right away if you are not doing well or get worse. Document Released: 11/05/2005 Document Revised: 11/10/2013 Document Reviewed: 07/13/2013 [facility] Patient Information 2015 [place], [place]. This information is not intended to replace advice given to you by your health care provider. Make sure you discuss any questions you have with your health care provider.

## 2015-03-21 ENCOUNTER — Other Ambulatory Visit: Payer: Self-pay | Admitting: Gynecology

## 2015-03-21 ENCOUNTER — Encounter: Payer: Self-pay | Admitting: Gynecology

## 2015-03-21 ENCOUNTER — Telehealth: Payer: Self-pay

## 2015-03-21 ENCOUNTER — Ambulatory Visit (INDEPENDENT_AMBULATORY_CARE_PROVIDER_SITE_OTHER): Payer: BC Managed Care – PPO

## 2015-03-21 ENCOUNTER — Ambulatory Visit (INDEPENDENT_AMBULATORY_CARE_PROVIDER_SITE_OTHER): Payer: BC Managed Care – PPO | Admitting: Gynecology

## 2015-03-21 VITALS — BP 118/78

## 2015-03-21 DIAGNOSIS — N8320 Unspecified ovarian cysts: Secondary | ICD-10-CM

## 2015-03-21 DIAGNOSIS — N83201 Unspecified ovarian cyst, right side: Secondary | ICD-10-CM

## 2015-03-21 DIAGNOSIS — N831 Corpus luteum cyst of ovary, unspecified side: Secondary | ICD-10-CM

## 2015-03-21 NOTE — Telephone Encounter (Signed)
Yes this would be fine

## 2015-03-21 NOTE — Progress Notes (Signed)
   Patient presented to the office today for follow-up on a previously seen right ovarian cyst at time of ultrasound that had been done in an effort to identify the IUD that had to be removed and replaced. The ultrasound had demonstrated that a right ovarian cyst which measured 4.8 x 3.5 x 4.9 cm with every side of 4.4 cm avascular and thin with reticular pattern was noted. Patient had opted not to have a CA 125 and is here for follow-up ultrasound. She is asymptomatic.  Ultrasound: Anteverted uterus with IUD seen in normal position. No changes small fibroids. Right ovary with collapse corpus luteum cyst measures 17 x 21 mm with positive color flow in the periphery. Left ovary was normal. No fluid in the cul-de-sac. Previous right ovarian cystic mass not seen.  Assessment/plan: Patient with complete resolution of previously seen right ovarian cyst. Patient asymptomatic. Patient to return next year for annual exam or when necessary.

## 2015-03-21 NOTE — Telephone Encounter (Signed)
Left message in voice mail okay for appt today.

## 2015-03-21 NOTE — Telephone Encounter (Signed)
Has ultrasound scheduled at noon today to follow up on ovarian cyst. Her period has started and she wondered if okay to still come for u/s?

## 2015-07-04 ENCOUNTER — Other Ambulatory Visit: Payer: Self-pay | Admitting: Gynecology

## 2015-07-04 ENCOUNTER — Encounter: Payer: Self-pay | Admitting: Gynecology

## 2015-07-04 ENCOUNTER — Ambulatory Visit (INDEPENDENT_AMBULATORY_CARE_PROVIDER_SITE_OTHER): Payer: BC Managed Care – PPO

## 2015-07-04 ENCOUNTER — Ambulatory Visit (INDEPENDENT_AMBULATORY_CARE_PROVIDER_SITE_OTHER): Payer: BC Managed Care – PPO | Admitting: Gynecology

## 2015-07-04 VITALS — BP 134/82

## 2015-07-04 DIAGNOSIS — Z8742 Personal history of other diseases of the female genital tract: Secondary | ICD-10-CM

## 2015-07-04 DIAGNOSIS — R102 Pelvic and perineal pain: Secondary | ICD-10-CM

## 2015-07-04 DIAGNOSIS — IMO0001 Reserved for inherently not codable concepts without codable children: Secondary | ICD-10-CM

## 2015-07-04 DIAGNOSIS — N921 Excessive and frequent menstruation with irregular cycle: Secondary | ICD-10-CM | POA: Diagnosis not present

## 2015-07-04 DIAGNOSIS — N923 Ovulation bleeding: Secondary | ICD-10-CM

## 2015-07-04 DIAGNOSIS — D251 Intramural leiomyoma of uterus: Secondary | ICD-10-CM

## 2015-07-04 DIAGNOSIS — N898 Other specified noninflammatory disorders of vagina: Secondary | ICD-10-CM | POA: Diagnosis not present

## 2015-07-04 DIAGNOSIS — T8383XA Hemorrhage of genitourinary prosthetic devices, implants and grafts, initial encounter: Secondary | ICD-10-CM

## 2015-07-04 LAB — WET PREP FOR TRICH, YEAST, CLUE: TRICH WET PREP: NONE SEEN

## 2015-07-04 LAB — CBC WITH DIFFERENTIAL/PLATELET
Basophils Absolute: 0 10*3/uL (ref 0.0–0.1)
Basophils Relative: 0 % (ref 0–1)
EOS PCT: 1 % (ref 0–5)
Eosinophils Absolute: 0 10*3/uL (ref 0.0–0.7)
HCT: 40.4 % (ref 36.0–46.0)
Hemoglobin: 13.2 g/dL (ref 12.0–15.0)
LYMPHS ABS: 1.5 10*3/uL (ref 0.7–4.0)
Lymphocytes Relative: 32 % (ref 12–46)
MCH: 29.5 pg (ref 26.0–34.0)
MCHC: 32.7 g/dL (ref 30.0–36.0)
MCV: 90.2 fL (ref 78.0–100.0)
MONO ABS: 0.5 10*3/uL (ref 0.1–1.0)
MONOS PCT: 10 % (ref 3–12)
MPV: 9.8 fL (ref 8.6–12.4)
Neutro Abs: 2.6 10*3/uL (ref 1.7–7.7)
Neutrophils Relative %: 57 % (ref 43–77)
PLATELETS: 267 10*3/uL (ref 150–400)
RBC: 4.48 MIL/uL (ref 3.87–5.11)
RDW: 12.9 % (ref 11.5–15.5)
WBC: 4.6 10*3/uL (ref 4.0–10.5)

## 2015-07-04 LAB — PREGNANCY, URINE: Preg Test, Ur: NEGATIVE

## 2015-07-04 MED ORDER — TINIDAZOLE 500 MG PO TABS: ORAL_TABLET | ORAL | Status: DC

## 2015-07-04 MED ORDER — FLUCONAZOLE 150 MG PO TABS: 150.0000 mg | ORAL_TABLET | Freq: Once | ORAL | Status: DC

## 2015-07-04 NOTE — Progress Notes (Signed)
   Patient is a 39 year old who presented to the office stating the pupil past few months her cycles of been heavy with passage of large clot. Interest recent she had intermenstrual spotting. She has a Mirena IUD that was replaced in March of this year. She had an ultrasound that demonstrated a right ovarian cyst which measured 4.8 x 3.5 x 4.9 cm with every side of 4.4 cm avascular and thin with reticular pattern was noted. Patient had opted not to have a CA 125 and is here for follow-up  and had a repeat ultrasound in May of this year which demonstrated the following:  Anteverted uterus with IUD seen in normal position. No changes small fibroids. Right ovary with collapse corpus luteum cyst measures 17 x 21 mm with positive color flow in the periphery. Left ovary was normal. No fluid in the cul-de-sac. Previous right ovarian cystic mass not seen.  Last Pap smear normal March 2015. Patient reports normal steady partner   Exam: Blood pressure 134/82 Gen. appearance well-developed: Nourished female in above-mentioned late Colon abdomen: Soft slightly tender left lower quadrant but no rebound or guarding Back: No CVA tenderness Pelvic: Bartholin urethra Skene was within normal limits Vagina creamy colored discharge Cervix: IUD string visualized Uterus: Anteverted upper limits of normal nontender no rebound or guarding Adnexa: Limited due to patient's abdominal girth and no tenderness elicited  Wet prep: Moderate yeast, positive amine, many clue cells, too numerous to count white blood cells too numerous to count bacteria  Urine pregnancy test negative   Ultrasound today:  Uterus measured 10.0 x 8.0 x 6.1 cm with endometrial stripe of 5 mm. A small intramural fibroid measured 15 x 14 mm was noted. The IUD was seen in the normal position. Right and left ovary were normal. No fluid in the cul-de-sac and no adnexal masses  Assessment/plan: #1 bacterial vaginosis will be treated with Tindamax 500  mg 4 tabs today repeat in 24 hours #2 yeast vaginitis will be treated with Diflucan 150 mg one by mouth #3 CBC result pending at time of this dictation

## 2015-07-04 NOTE — Patient Instructions (Signed)
Fluconazole injection What is this medicine? FLUCONAZOLE (floo KON na zole) is an antifungal medicine. It is used to treat or prevent certain kinds of fungal or yeast infections. This medicine may be used for other purposes; ask your health care provider or pharmacist if you have questions. COMMON BRAND NAME(S): Diflucan What should I tell my health care provider before I take this medicine? They need to know if you have any of these conditions: -history of irregular heart beat -kidney disease -an unusual or allergic reaction to fluconazole, other antifungal medicines, foods, dyes or preservatives -pregnant or trying to get pregnant -breast-feeding How should I use this medicine? This medicine is for injection into a vein. It is usually given by a health care professional in a hospital or clinic setting. If you get this medicine at home, you will be taught how to prepare and give this medicine. Use exactly as directed. Take your medicine at regular intervals. Do not take your medicine more often than directed. It is important that you put your used needles and syringes in a special sharps container. Do not put them in a trash can. If you do not have a sharps container, call your pharmacist or healthcare provider to get one. Talk to your pediatrician regarding the use of this medicine in children. Special care may be needed. Overdosage: If you think you have taken too much of this medicine contact a poison control center or emergency room at once. NOTE: This medicine is only for you. Do not share this medicine with others. What if I miss a dose? This does not apply. What may interact with this medicine? Do not take this medicine with any of the following medications: -cisapride -pimozide -red yeast rice This medicine may also interact with the following medications: -birth control pills -cyclosporine -diuretics like hydrochlorothiazide -medicines for diabetes that are taken by  mouth -medicines for high cholesterol like atorvastatin, lovastatin or simvastatin -phenytoin -ramelteon -rifabutin -rifampin -some medicines for anxiety or sleep -tacrolimus -terfenadine -theophylline -tofacitinib -warfarin This list may not describe all possible interactions. Give your health care provider a list of all the medicines, herbs, non-prescription drugs, or dietary supplements you use. Also tell them if you smoke, drink alcohol, or use illegal drugs. Some items may interact with your medicine. What should I watch for while using this medicine? Tell your doctor if your symptoms do not improve. If you are taking this medicine for a long time you may need blood work. Some fungal infections need many weeks or months of treatment to cure completely. Alcohol can increase possible damage to your liver from this medicine. Avoid alcoholic drinks. What side effects may I notice from receiving this medicine? Side effects that you should report to your doctor or health care professional as soon as possible: -allergic reactions like skin rash or itching, hives, swelling of the lips, mouth, tongue, or throat -dark urine -feeling dizzy or faint -irregular heartbeat or chest pain -pain, redness at site of injection -redness, blistering, peeling or loosening of the skin, including inside the mouth -stomach pain -trouble breathing -unusual bruising or bleeding -vomiting -yellowing of the eyes or skin Side effects that usually do not require medical attention (report to your doctor or health care professional if they continue or are bothersome): -changes in how food tastes -diarrhea -headache -stomach upset, nausea This list may not describe all possible side effects. Call your doctor for medical advice about side effects. You may report side effects to FDA at 1-800-FDA-1088. Where should  I keep my medicine? Keep out of the reach of children. If you are using this medicine at home, you  will be instructed on how to store this medicine. Throw away any unused medicine after the expiration date on the label. NOTE: This sheet is a summary. It may not cover all possible information. If you have questions about this medicine, talk to your doctor, pharmacist, or health care provider.  2015, [employer] Standard. (2013-06-13 15:51:41) Tinidazole tablets What is this medicine? TINIDAZOLE (tye NI da zole) is an antiinfective. It is used to treat amebiasis, giardiasis, trichomoniasis, and vaginosis. It will not work for colds, flu, or other viral infections. This medicine may be used for other purposes; ask your health care provider or pharmacist if you have questions. COMMON BRAND NAME(S): Tindamax What should I tell my health care provider before I take this medicine? They need to know if you have any of these conditions: -anemia or other blood disorders -if you frequently drink alcohol containing drinks -receiving hemodialysis -seizure disorder -an unusual or allergic reaction to tinidazole, other medicines, foods, dyes, or preservatives -pregnant or trying to get pregnant -breast-feeding How should I use this medicine? Take this medicine by mouth with a full glass of water. Follow the directions on the prescription label. Take with food. Take your medicine at regular intervals. Do not take your medicine more often than directed. Take all of your medicine as directed even if you think you are better. Do not skip doses or stop your medicine early. Talk to your pediatrician regarding the use of this medicine in children. While this drug may be prescribed for children as young as 46 years of age for selected conditions, precautions do apply. Overdosage: If you think you have taken too much of this medicine contact a poison control center or emergency room at once. NOTE: This medicine is only for you. Do not share this medicine with others. What if I miss a dose? If you miss a dose,  take it as soon as you can. If it is almost time for your next dose, take only that dose. Do not take double or extra doses. What may interact with this medicine? Do not take this medicine with any of the following medications: -alcohol or any product that contains alcohol -amprenavir oral solution -disulfiram -paclitaxel injection -ritonavir oral solution -sertraline oral solution -sulfamethoxazole-trimethoprim injection This medicine may also interact with the following medications: -cholestyramine -cimetidine -conivaptan -cyclosporin -fluorouracil -fosphenytoin, phenytoin -ketoconazole -lithium -phenobarbital -tacrolimus -warfarin This list may not describe all possible interactions. Give your health care provider a list of all the medicines, herbs, non-prescription drugs, or dietary supplements you use. Also tell them if you smoke, drink alcohol, or use illegal drugs. Some items may interact with your medicine. What should I watch for while using this medicine? Tell your doctor or health care professional if your symptoms do not improve or if they get worse. Avoid alcoholic drinks while you are taking this medicine and for three days afterward. Alcohol may make you feel dizzy, sick, or flushed. If you are being treated for a sexually transmitted disease, avoid sexual contact until you have finished your treatment. Your sexual partner may also need treatment. What side effects may I notice from receiving this medicine? Side effects that you should report to your doctor or health care professional as soon as possible: -allergic reactions like skin rash, itching or hives, swelling of the face, lips, or tongue -breathing problems -confusion, depression -dark or white patches in  the mouth -feeling faint or lightheaded, falls -fever, infection -numbness, tingling, pain or weakness in the hands or feet -pain when passing urine -seizures -unusually weak or tired -vaginal irritation  or discharge -vomiting Side effects that usually do not require medical attention (report to your doctor or health care professional if they continue or are bothersome): -dark brown or reddish urine -diarrhea -headache -loss of appetite -metallic taste -nausea -stomach upset This list may not describe all possible side effects. Call your doctor for medical advice about side effects. You may report side effects to FDA at 1-800-FDA-1088. Where should I keep my medicine? Keep out of the reach of children. Store at room temperature between 15 and 30 degrees C (59 and 86 degrees F). Protect from light and moisture. Keep container tightly closed. Throw away any unused medicine after the expiration date. NOTE: This sheet is a summary. It may not cover all possible information. If you have questions about this medicine, talk to your doctor, pharmacist, or health care provider.  2015, [employer]vier/Gold Standard. (2008-08-02 15:22:28) Monilial Vaginitis Vaginitis in a soreness, swelling and redness (inflammation) of the vagina and vulva. Monilial vaginitis is not a sexually transmitted infection. CAUSES  Yeast vaginitis is caused by yeast (candida) that is normally found in your vagina. With a yeast infection, the candida has overgrown in number to a point that upsets the chemical balance. SYMPTOMS   White, thick vaginal discharge.  Swelling, itching, redness and irritation of the vagina and possibly the lips of the vagina (vulva).  Burning or painful urination.  Painful intercourse. DIAGNOSIS  Things that may contribute to monilial vaginitis are:  Postmenopausal and virginal states.  Pregnancy.  Infections.  Being tired, sick or stressed, especially if you had monilial vaginitis in the past.  Diabetes. Good control will help lower the chance.  Birth control pills.  Tight fitting garments.  Using bubble bath, feminine sprays, douches or deodorant tampons.  Taking certain  medications that kill germs (antibiotics).  Sporadic recurrence can occur if you become ill. TREATMENT  Your caregiver will give you medication.  There are several kinds of anti monilial vaginal creams and suppositories specific for monilial vaginitis. For recurrent yeast infections, use a suppository or cream in the vagina 2 times a week, or as directed.  Anti-monilial or steroid cream for the itching or irritation of the vulva may also be used. Get your caregiver's permission.  Painting the vagina with methylene blue solution may help if the monilial cream does not work.  Eating yogurt may help prevent monilial vaginitis. HOME CARE INSTRUCTIONS   Finish all medication as prescribed.  Do not have sex until treatment is completed or after your caregiver tells you it is okay.  Take warm sitz baths.  Do not douche.  Do not use tampons, especially scented ones.  Wear cotton underwear.  Avoid tight pants and panty hose.  Tell your sexual partner that you have a yeast infection. They should go to their caregiver if they have symptoms such as mild rash or itching.  Your sexual partner should be treated as well if your infection is difficult to eliminate.  Practice safer sex. Use condoms.  Some vaginal medications cause latex condoms to fail. Vaginal medications that harm condoms are:  Cleocin cream.  Butoconazole (Femstat).  Terconazole (Terazol) vaginal suppository.  Miconazole (Monistat) (may be purchased over the counter). SEEK MEDICAL CARE IF:   You have a temperature by mouth above 102 F (38.9 C).  The infection is getting worse  after 2 days of treatment.  The infection is not getting better after 3 days of treatment.  You develop blisters in or around your vagina.  You develop vaginal bleeding, and it is not your menstrual period.  You have pain when you urinate.  You develop intestinal problems.  You have pain with sexual intercourse. Document  Released: 08/15/2005 Document Revised: 01/28/2012 Document Reviewed: 04/29/2009 [facility] Patient Information 2015 [place], [place]. This information is not intended to replace advice given to you by your health care provider. Make sure you discuss any questions you have with your health care provider. Bacterial Vaginosis Bacterial vaginosis is a vaginal infection that occurs when the normal balance of bacteria in the vagina is disrupted. It results from an overgrowth of certain bacteria. This is the most common vaginal infection in women of childbearing age. Treatment is important to prevent complications, especially in pregnant women, as it can cause a premature delivery. CAUSES  Bacterial vaginosis is caused by an increase in harmful bacteria that are normally present in smaller amounts in the vagina. Several different kinds of bacteria can cause bacterial vaginosis. However, the reason that the condition develops is not fully understood. RISK FACTORS Certain activities or behaviors can put you at an increased risk of developing bacterial vaginosis, including:  Having a new sex partner or multiple sex partners.  Douching.  Using an intrauterine device (IUD) for contraception. Women do not get bacterial vaginosis from toilet seats, bedding, swimming pools, or contact with objects around them. SIGNS AND SYMPTOMS  Some women with bacterial vaginosis have no signs or symptoms. Common symptoms include:  Grey vaginal discharge.  A fishlike odor with discharge, especially after sexual intercourse.  Itching or burning of the vagina and vulva.  Burning or pain with urination. DIAGNOSIS  Your health care provider will take a medical history and examine the vagina for signs of bacterial vaginosis. A sample of vaginal fluid may be taken. Your health care provider will look at this sample under a microscope to check for bacteria and abnormal cells. A vaginal pH test may also be done.  TREATMENT   Bacterial vaginosis may be treated with antibiotic medicines. These may be given in the form of a pill or a vaginal cream. A second round of antibiotics may be prescribed if the condition comes back after treatment.  HOME CARE INSTRUCTIONS   Only take over-the-counter or prescription medicines as directed by your health care provider.  If antibiotic medicine was prescribed, take it as directed. Make sure you finish it even if you start to feel better.  Do not have sex until treatment is completed.  Tell all sexual partners that you have a vaginal infection. They should see their health care provider and be treated if they have problems, such as a mild rash or itching.  Practice safe sex by using condoms and only having one sex partner. SEEK MEDICAL CARE IF:   Your symptoms are not improving after 3 days of treatment.  You have increased discharge or pain.  You have a fever. MAKE SURE YOU:   Understand these instructions.  Will watch your condition.  Will get help right away if you are not doing well or get worse. FOR MORE INFORMATION  Centers for Disease Control and Prevention, Division of STD Prevention: [employer] Association (ASHA): www.ashastd.org  Document Released: 11/05/2005 Document Revised: 08/26/2013 Document Reviewed: 06/17/2013 [facility] Patient Information 2015 [place], [place]. This information is not intended to replace advice given to you by  your health care provider. Make sure you discuss any questions you have with your health care provider.  

## 2015-07-05 ENCOUNTER — Encounter: Payer: Self-pay | Admitting: Gynecology

## 2015-07-05 ENCOUNTER — Telehealth: Payer: Self-pay | Admitting: *Deleted

## 2015-07-05 ENCOUNTER — Ambulatory Visit (INDEPENDENT_AMBULATORY_CARE_PROVIDER_SITE_OTHER): Payer: BC Managed Care – PPO | Admitting: Gynecology

## 2015-07-05 VITALS — BP 118/76 | Temp 97.5°F | Wt 269.0 lb

## 2015-07-05 DIAGNOSIS — N3000 Acute cystitis without hematuria: Secondary | ICD-10-CM | POA: Diagnosis not present

## 2015-07-05 DIAGNOSIS — M545 Low back pain, unspecified: Secondary | ICD-10-CM

## 2015-07-05 DIAGNOSIS — M5489 Other dorsalgia: Secondary | ICD-10-CM

## 2015-07-05 LAB — URINALYSIS W MICROSCOPIC + REFLEX CULTURE
Bilirubin Urine: NEGATIVE
CASTS: NONE SEEN [LPF]
CRYSTALS: NONE SEEN [HPF]
Glucose, UA: NEGATIVE
KETONES UR: NEGATIVE
Nitrite: NEGATIVE
Protein, ur: NEGATIVE
Specific Gravity, Urine: 1.02 (ref 1.001–1.035)
YEAST: NONE SEEN [HPF]
pH: 6 (ref 5.0–8.0)

## 2015-07-05 MED ORDER — NITROFURANTOIN MONOHYD MACRO 100 MG PO CAPS: 100.0000 mg | ORAL_CAPSULE | Freq: Two times a day (BID) | ORAL | Status: DC

## 2015-07-05 MED ORDER — KETOROLAC TROMETHAMINE 60 MG/2ML IM SOLN
60.0000 mg | Freq: Once | INTRAMUSCULAR | Status: AC
  Administered 2015-07-05: 60 mg via INTRAMUSCULAR

## 2015-07-05 NOTE — Patient Instructions (Signed)
UTI Nitrofurantoin tablets or capsules What is this medicine? NITROFURANTOIN (nye troe fyoor AN toyn) is an antibiotic. It is used to treat urinary tract infections. This medicine may be used for other purposes; ask your health care provider or pharmacist if you have questions. COMMON BRAND NAME(S): Macrobid, Macrodantin, Urotoin What should I tell my health care provider before I take this medicine? They need to know if you have any of these conditions: -anemia -diabetes -glucose-6-phosphate dehydrogenase deficiency -kidney disease -liver disease -lung disease -other chronic illness -an unusual or allergic reaction to nitrofurantoin, other antibiotics, other medicines, foods, dyes or preservatives -pregnant or trying to get pregnant -breast-feeding How should I use this medicine? Take this medicine by mouth with a glass of water. Follow the directions on the prescription label. Take this medicine with food or milk. Take your doses at regular intervals. Do not take your medicine more often than directed. Do not stop taking except on your doctor's advice. Talk to your pediatrician regarding the use of this medicine in children. While this drug may be prescribed for selected conditions, precautions do apply. Overdosage: If you think you have taken too much of this medicine contact a poison control center or emergency room at once. NOTE: This medicine is only for you. Do not share this medicine with others. What if I miss a dose? If you miss a dose, take it as soon as you can. If it is almost time for your next dose, take only that dose. Do not take double or extra doses. What may interact with this medicine? -antacids containing magnesium trisilicate -probenecid -quinolone antibiotics like ciprofloxacin, lomefloxacin, norfloxacin and ofloxacin -sulfinpyrazone This list may not describe all possible interactions. Give your health care provider a list of all the medicines, herbs,  non-prescription drugs, or dietary supplements you use. Also tell them if you smoke, drink alcohol, or use illegal drugs. Some items may interact with your medicine. What should I watch for while using this medicine? Tell your doctor or health care professional if your symptoms do not improve or if you get new symptoms. Drink several glasses of water a day. If you are taking this medicine for a long time, visit your doctor for regular checks on your progress. If you are diabetic, you may get a false positive result for sugar in your urine with certain brands of urine tests. Check with your doctor. What side effects may I notice from receiving this medicine? Side effects that you should report to your doctor or health care professional as soon as possible: -allergic reactions like skin rash or hives, swelling of the face, lips, or tongue -chest pain -cough -difficulty breathing -dizziness, drowsiness -fever or infection -joint aches or pains -pale or blue-tinted skin -redness, blistering, peeling or loosening of the skin, including inside the mouth -tingling, burning, pain, or numbness in hands or feet -unusual bleeding or bruising -unusually weak or tired -yellowing of eyes or skin Side effects that usually do not require medical attention (report to your doctor or health care professional if they continue or are bothersome): -dark urine -diarrhea -headache -loss of appetite -nausea or vomiting -temporary hair loss This list may not describe all possible side effects. Call your doctor for medical advice about side effects. You may report side effects to FDA at 1-800-FDA-1088. Where should I keep my medicine? Keep out of the reach of children. Store at room temperature between 15 and 30 degrees C (59 and 86 degrees F). Protect from light. Throw away any  unused medicine after the expiration date. NOTE: This sheet is a summary. It may not cover all possible information. If you have  questions about this medicine, talk to your doctor, pharmacist, or health care provider.  2015, [employer]vier/Gold Standard. (2008-05-26 15:56:47) Urinary Tract Infection Urinary tract infections (UTIs) can develop anywhere along your urinary tract. Your urinary tract is your body's drainage system for removing wastes and extra water. Your urinary tract includes two kidneys, two ureters, a bladder, and a urethra. Your kidneys are a pair of bean-shaped organs. Each kidney is about the size of your fist. They are located below your ribs, one on each side of your spine. CAUSES Infections are caused by microbes, which are microscopic organisms, including fungi, viruses, and bacteria. These organisms are so small that they can only be seen through a microscope. Bacteria are the microbes that most commonly cause UTIs. SYMPTOMS  Symptoms of UTIs may vary by age and gender of the patient and by the location of the infection. Symptoms in young women typically include a frequent and intense urge to urinate and a painful, burning feeling in the bladder or urethra during urination. Older women and men are more likely to be tired, shaky, and weak and have muscle aches and abdominal pain. A fever may mean the infection is in your kidneys. Other symptoms of a kidney infection include pain in your back or sides below the ribs, nausea, and vomiting. DIAGNOSIS To diagnose a UTI, your caregiver will ask you about your symptoms. Your caregiver also will ask to provide a urine sample. The urine sample will be tested for bacteria and white blood cells. White blood cells are made by your body to help fight infection. TREATMENT  Typically, UTIs can be treated with medication. Because most UTIs are caused by a bacterial infection, they usually can be treated with the use of antibiotics. The choice of antibiotic and length of treatment depend on your symptoms and the type of bacteria causing your infection. HOME CARE  INSTRUCTIONS  If you were prescribed antibiotics, take them exactly as your caregiver instructs you. Finish the medication even if you feel better after you have only taken some of the medication.  Drink enough water and fluids to keep your urine clear or pale yellow.  Avoid caffeine, tea, and carbonated beverages. They tend to irritate your bladder.  Empty your bladder often. Avoid holding urine for long periods of time.  Empty your bladder before and after sexual intercourse.  After a bowel movement, women should cleanse from front to back. Use each tissue only once. SEEK MEDICAL CARE IF:   You have back pain.  You develop a fever.  Your symptoms do not begin to resolve within 3 days. SEEK IMMEDIATE MEDICAL CARE IF:   You have severe back pain or lower abdominal pain.  You develop chills.  You have nausea or vomiting.  You have continued burning or discomfort with urination. MAKE SURE YOU:   Understand these instructions.  Will watch your condition.  Will get help right away if you are not doing well or get worse. Document Released: 08/15/2005 Document Revised: 05/06/2012 Document Reviewed: 12/14/2011 [facility] Patient Information 2015 [place], [place]. This information is not intended to replace advice given to you by your health care provider. Make sure you discuss any questions you have with your health care provider. Lumbosacral Strain Lumbosacral strain is a strain of any of the parts that make up your lumbosacral vertebrae. Your lumbosacral vertebrae are the bones that  make up the lower third of your backbone. Your lumbosacral vertebrae are held together by muscles and tough, fibrous tissue (ligaments).  CAUSES  A sudden blow to your back can cause lumbosacral strain. Also, anything that causes an excessive stretch of the muscles in the low back can cause this strain. This is typically seen when people exert themselves strenuously, fall, lift heavy objects, bend, or  crouch repeatedly. RISK FACTORS  Physically demanding work.  Participation in pushing or pulling sports or sports that require a sudden twist of the back (tennis, golf, baseball).  Weight lifting.  Excessive lower back curvature.  Forward-tilted pelvis.  Weak back or abdominal muscles or both.  Tight hamstrings. SIGNS AND SYMPTOMS  Lumbosacral strain may cause pain in the area of your injury or pain that moves (radiates) down your leg.  DIAGNOSIS Your health care provider can often diagnose lumbosacral strain through a physical exam. In some cases, you may need tests such as X-ray exams.  TREATMENT  Treatment for your lower back injury depends on many factors that your clinician will have to evaluate. However, most treatment will include the use of anti-inflammatory medicines. HOME CARE INSTRUCTIONS   Avoid hard physical activities (tennis, racquetball, waterskiing) if you are not in proper physical condition for it. This may aggravate or create problems.  If you have a back problem, avoid sports requiring sudden body movements. Swimming and walking are generally safer activities.  Maintain good posture.  Maintain a healthy weight.  For acute conditions, you may put ice on the injured area.  Put ice in a plastic bag.  Place a towel between your skin and the bag.  Leave the ice on for 20 minutes, 2-3 times a day.  When the low back starts healing, stretching and strengthening exercises may be recommended. SEEK MEDICAL CARE IF:  Your back pain is getting worse.  You experience severe back pain not relieved with medicines. SEEK IMMEDIATE MEDICAL CARE IF:   You have numbness, tingling, weakness, or problems with the use of your arms or legs.  There is a change in bowel or bladder control.  You have increasing pain in any area of the body, including your belly (abdomen).  You notice shortness of breath, dizziness, or feel faint.  You feel sick to your stomach  (nauseous), are throwing up (vomiting), or become sweaty.  You notice discoloration of your toes or legs, or your feet get very cold. MAKE SURE YOU:   Understand these instructions.  Will watch your condition.  Will get help right away if you are not doing well or get worse. Document Released: 08/15/2005 Document Revised: 11/10/2013 Document Reviewed: 06/24/2013 [facility] Patient Information 2015 [place], [place]. This information is not intended to replace advice given to you by your health care provider. Make sure you discuss any questions you have with your health care provider.

## 2015-07-05 NOTE — Telephone Encounter (Signed)
Pt called c/o still having pelvic pain and now sharp back pain. Pt said on pain scale level 10, hurts with walking, standing, sitting. No bowl movement in 2 days. States pain started this am and has not gotten any better, pt informed to come in for OV, states she is on her way now to be seen.

## 2015-07-05 NOTE — Addendum Note (Signed)
Addended by: Thurnell Garbe A on: 07/05/2015 02:37 PM   Modules accepted: Orders

## 2015-07-05 NOTE — Progress Notes (Signed)
   Patient is a 39 year old who presented to the office today with severe low back pain which started yesterday afternoon after she left my office. Patient had been complaining for the past several months of having heavy menses with passage of large blood clots. She does have a Mirena IUD that was placed in March of this year for contraception. An ultrasound done in the office at the same time demonstrated the following:  Uterus measured 10.0 x 8.0 x 6.1 cm with endometrial stripe of 5 mm. A small intramural fibroid measured 15 x 14 mm was noted. The IUD was seen in the normal position. Right and left ovary were normal. No fluid in the cul-de-sac and no adnexal masses  She had a negative pregnancy test and had a wet prep which demonstrated yeast and bacterial vaginosis and she was prescribed Tindamax 500 mg 4 tablets daily for 2 days and 1 Diflucan tablet 150 mg. She had a normal CBC with a white count of 4.6 hemoglobin and hematocrit 13.2 and 40.4 respectively.  Patient states that this pain in her back is affecting her walking it is related a 10 over 10 she denies any radiculopathy. She has no dysuria. Has had constipation for 2 days.  Urinalysis today: 6-10 WBC, 3-10 RBC, and many bacteria  Exam today: Blood pressure 118/76  temperature 97.5   weight 269 pounds   BMI 44.08 kg/m Gen. appearance patient had acute distress with low back pain is affecting her back No CVA tenderness but very tender at the sacroiliac joints on the right Patient with excruciating pain on 30 leg raises or laying flat Patient with no decreased sensitivity of the lower extremities Abdomen: Soft nontender no rebound or guarding Pelvic exam not done ultrasound and pelvic exam done yesterday.  Patient had informed me that Dr. 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 She was given Toradol 60 mg IM today #2 incidental finding of a urinary tract infection will be prescribed Macrobid one by mouth twice a day for 7 days.

## 2015-07-06 LAB — URINE CULTURE

## 2015-07-07 ENCOUNTER — Other Ambulatory Visit: Payer: Self-pay | Admitting: Gynecology

## 2015-07-07 DIAGNOSIS — R3129 Other microscopic hematuria: Secondary | ICD-10-CM

## 2015-07-08 ENCOUNTER — Other Ambulatory Visit: Payer: BC Managed Care – PPO

## 2015-07-08 DIAGNOSIS — R3129 Other microscopic hematuria: Secondary | ICD-10-CM

## 2015-07-09 LAB — URINALYSIS W MICROSCOPIC + REFLEX CULTURE
BACTERIA UA: NONE SEEN [HPF]
BILIRUBIN URINE: NEGATIVE
CRYSTALS: NONE SEEN [HPF]
Casts: NONE SEEN [LPF]
Glucose, UA: NEGATIVE
HGB URINE DIPSTICK: NEGATIVE
KETONES UR: NEGATIVE
Leukocytes, UA: NEGATIVE
Nitrite: NEGATIVE
Protein, ur: NEGATIVE
RBC / HPF: NONE SEEN RBC/HPF (ref <=2)
SPECIFIC GRAVITY, URINE: 1.016 (ref 1.001–1.035)
SQUAMOUS EPITHELIAL / LPF: NONE SEEN [HPF] (ref <=5)
WBC UA: NONE SEEN WBC/HPF (ref <=5)
Yeast: NONE SEEN [HPF]
pH: 6.5 (ref 5.0–8.0)

## 2015-08-01 ENCOUNTER — Telehealth: Payer: Self-pay

## 2015-08-01 MED ORDER — METRONIDAZOLE 500 MG PO TABS: 500.0000 mg | ORAL_TABLET | Freq: Two times a day (BID) | ORAL | Status: DC

## 2015-08-01 MED ORDER — FLUCONAZOLE 150 MG PO TABS: 150.0000 mg | ORAL_TABLET | Freq: Once | ORAL | Status: DC

## 2015-08-01 NOTE — Telephone Encounter (Signed)
Patient informed. Rx's sent. 

## 2015-08-01 NOTE — Telephone Encounter (Signed)
Patient complaining of vaginal discharge with odor. Mid August was treated for a bacterial infection and yeast inf but she said she felt like symptoms never completely resolved. Would like another Rx.

## 2015-08-01 NOTE — Telephone Encounter (Signed)
Flagyl 500 mg one by mouth twice a day for 7 days. Diflucan 150 mg one by mouth. If symptoms continue she needs to come to the office to be rechecked and tested

## 2015-08-01 NOTE — Telephone Encounter (Signed)
Patient called stating "I still have bacterial infection and want to see if I can get more medication".  I called her back and per DPR access note left a message and asked her to call me back. I need to know what symptoms she is having and if symptoms every resolved after being treated mid August.

## 2015-08-09 ENCOUNTER — Other Ambulatory Visit: Payer: Self-pay | Admitting: *Deleted

## 2015-08-09 DIAGNOSIS — I83812 Varicose veins of left lower extremities with pain: Secondary | ICD-10-CM

## 2015-08-10 ENCOUNTER — Encounter: Payer: Self-pay | Admitting: Vascular Surgery

## 2015-08-15 ENCOUNTER — Encounter (HOSPITAL_COMMUNITY): Payer: BC Managed Care – PPO

## 2015-08-15 ENCOUNTER — Encounter: Payer: BC Managed Care – PPO | Admitting: Vascular Surgery

## 2015-08-30 ENCOUNTER — Ambulatory Visit (INDEPENDENT_AMBULATORY_CARE_PROVIDER_SITE_OTHER): Payer: BC Managed Care – PPO | Admitting: Women's Health

## 2015-08-30 ENCOUNTER — Encounter: Payer: Self-pay | Admitting: Women's Health

## 2015-08-30 VITALS — BP 120/84 | Ht 65.0 in | Wt 265.0 lb

## 2015-08-30 DIAGNOSIS — R350 Frequency of micturition: Secondary | ICD-10-CM

## 2015-08-30 DIAGNOSIS — N938 Other specified abnormal uterine and vaginal bleeding: Secondary | ICD-10-CM

## 2015-08-30 DIAGNOSIS — N898 Other specified noninflammatory disorders of vagina: Secondary | ICD-10-CM | POA: Diagnosis not present

## 2015-08-30 LAB — URINALYSIS W MICROSCOPIC + REFLEX CULTURE
BACTERIA UA: NONE SEEN [HPF]
BILIRUBIN URINE: NEGATIVE
CRYSTALS: NONE SEEN [HPF]
Casts: NONE SEEN [LPF]
GLUCOSE, UA: NEGATIVE
Hgb urine dipstick: NEGATIVE
LEUKOCYTES UA: NEGATIVE
Nitrite: NEGATIVE
PROTEIN: NEGATIVE
RBC / HPF: NONE SEEN RBC/HPF (ref <=2)
SPECIFIC GRAVITY, URINE: 1.025 (ref 1.001–1.035)
WBC UA: NONE SEEN WBC/HPF (ref <=5)
YEAST: NONE SEEN [HPF]
pH: 5.5 (ref 5.0–8.0)

## 2015-08-30 LAB — WET PREP FOR TRICH, YEAST, CLUE
CLUE CELLS WET PREP: NONE SEEN
Trich, Wet Prep: NONE SEEN
YEAST WET PREP: NONE SEEN

## 2015-08-30 MED ORDER — MEGESTROL ACETATE 20 MG PO TABS: 20.0000 mg | ORAL_TABLET | Freq: Two times a day (BID) | ORAL | Status: DC

## 2015-08-30 NOTE — Progress Notes (Signed)
Patient ID: [name], female   DOB: 05-05-1976, 39 y.o.   MRN: [id number] Presents with a 2 week history of cloudy vaginal discharge, odor, and some abdominal pain.  Denies itching or urinary symptoms.  Treated 9/12 for yeast and bacterial vaginosis.  Paragard placed 3/16 with monthly cycles and occasional light pink spotting on toilet tissue, not requiring a pad.  Has had recurrent BV infections since paragard placement. Had Mirena IUD prior with no infections. Same partner.  Exam:  Appears well.  External genitalia normal.  Speculum exam reveals normal vaginal walls and cervix with paragard strings seen in cervical os, minimal cloudy discharge seen, no odor noted. Wet Prep: Moderate WBC's UA:  negative  Paragard in place Normal GYN exam  Plan: Prescribed boric acid gel caps 600mg , place one cap vaginally twice weekly, for yeast and BV prevention.  Discussed physiologic discharge is normal, continue yeast and BV prevention.   TSH, keep menstrual record. Call if symptoms worsen.

## 2015-09-15 ENCOUNTER — Ambulatory Visit (INDEPENDENT_AMBULATORY_CARE_PROVIDER_SITE_OTHER): Payer: BC Managed Care – PPO | Admitting: Gynecology

## 2015-09-15 ENCOUNTER — Encounter: Payer: Self-pay | Admitting: Gynecology

## 2015-09-15 VITALS — BP 124/82

## 2015-09-15 DIAGNOSIS — R102 Pelvic and perineal pain: Secondary | ICD-10-CM | POA: Diagnosis not present

## 2015-09-15 DIAGNOSIS — Z30432 Encounter for removal of intrauterine contraceptive device: Secondary | ICD-10-CM | POA: Diagnosis not present

## 2015-09-15 NOTE — Progress Notes (Signed)
   Patient is a 39 year old who in March of this year had a ParaGard T380A IUD placed after the Mirena IUD had been removed because she was having recurrent ovarian cyst. Patient states abrupt before her menses she skips abdominal cramping and wanted to have the IUD removed. She'll be using barrier contraception. Her partner's in the process of getting a vasectomy.  Exam: Abdomen: Soft nontender no rebound or guarding Pelvic: Bartholin urethra Skene was within normal limits Vagina: Menstrual blood present Cervix: IUD string visualized Uterus: Anteverted normal size shape and consistency Adnexa: No palpable masses or tenderness Rectal exam not done  The IUD string was grasped with a ring forcep and the IUD was retrieved shown to the patient discarded.  Assessment/plan: Patient is a ParaGard T380A IUD was removed per her request because of abdominal cramping right before menses. Pelvic examination was unremarkable. She will use barrier contraception. She refused the flu vaccine today. She will return back in March for annual exam or when necessary. Patient had an ultrasound in August of this year as a result of her history of ovarian cysts and her ultrasounds completely normal. The IUD was at that time noted to be in the right position.

## 2015-10-19 ENCOUNTER — Ambulatory Visit (INDEPENDENT_AMBULATORY_CARE_PROVIDER_SITE_OTHER): Payer: BC Managed Care – PPO | Admitting: Gynecology

## 2015-10-19 ENCOUNTER — Encounter: Payer: Self-pay | Admitting: Gynecology

## 2015-10-19 VITALS — BP 116/74

## 2015-10-19 DIAGNOSIS — Z309 Encounter for contraceptive management, unspecified: Secondary | ICD-10-CM

## 2015-10-19 DIAGNOSIS — Z30011 Encounter for initial prescription of contraceptive pills: Secondary | ICD-10-CM

## 2015-10-19 MED ORDER — LEVONORGESTREL-ETHINYL ESTRAD 0.1-20 MG-MCG PO TABS: 1.0000 | ORAL_TABLET | Freq: Every day | ORAL | Status: DC

## 2015-10-19 NOTE — Patient Instructions (Signed)

## 2015-10-19 NOTE — Progress Notes (Signed)
   Patient is a 39 year old who was seen the office on October 27 requesting that her ParaGard T380A IUD be removed which had been placed in March of the same year. Prior to that she had a Mirena IUD for 5 years and wanted a nonhormonal IUD. Her husband still has not decided on having a vasectomy and she wanted discuss contraceptive options. We discussed all the following:  Mirena IUD Skyla IUD, Kyleena IUD ParaGard T380A IUD Nexplanon Maneuver rate and Depo-Provera injection every 3 months Low dose oral contraceptive pill  All the above risk benefits and pros and cons were discussed to the patient as well as routes of administration. Patient several years ago had been on the oral contraceptive pill without any problems. Patient's fully aware of the potential risk of DVT and pulmonary embolism since she is overweight but accepts. She denies any family history of any bleeding or clotting disorders.  Assessment/plan: Patient will be initiated a 20 g pill Alesse which she will start on the second day of her upcoming cycle. Patient's otherwise scheduled to return back in March of next year for annual exam. Patient declined flu vaccine today. Greater than 90% of the time was spent discussing contraceptive options and showing models to patient.

## 2017-04-03 ENCOUNTER — Encounter: Payer: Self-pay | Admitting: Gynecology

## 2017-10-30 ENCOUNTER — Other Ambulatory Visit: Payer: Self-pay | Admitting: Nurse Practitioner

## 2017-10-30 DIAGNOSIS — Z1231 Encounter for screening mammogram for malignant neoplasm of breast: Secondary | ICD-10-CM

## 2017-10-31 ENCOUNTER — Encounter: Payer: Self-pay | Admitting: Gynecology

## 2017-10-31 ENCOUNTER — Ambulatory Visit: Payer: BC Managed Care – PPO | Admitting: Gynecology

## 2017-10-31 VITALS — BP 118/70 | Ht 66.0 in | Wt 276.0 lb

## 2017-10-31 DIAGNOSIS — Z01419 Encounter for gynecological examination (general) (routine) without abnormal findings: Secondary | ICD-10-CM | POA: Diagnosis not present

## 2017-10-31 NOTE — Patient Instructions (Addendum)
Follow-up for your mammogram as scheduled.  Follow-up in 1 year, sooner as needed.

## 2017-10-31 NOTE — Progress Notes (Signed)
    [name] 1976-02-16 [id number]        41 y.o.  [id number] for annual gynecologic exam.  Former patient of Dr. Toney Rakes.  Doing well with regular monthly menses.  Using condom contraception for now but husband in the process of arranging vasectomy.  Without GYN complaints.  Past medical history,surgical history, problem list, medications, allergies, family history and social history were all reviewed and documented as reviewed in the EPIC chart.  ROS:  Performed with pertinent positives and negatives included in the history, assessment and plan.   Additional significant findings : None   Exam: [name] assistant Vitals:   10/31/17 1549  BP: 118/70  Weight: 276 lb (125.2 kg)  Height: 5\' 6"  (1.676 m)   Body mass index is 44.55 kg/m.  General appearance:  Normal affect, orientation and appearance. Skin: Grossly normal HEENT: Without gross lesions.  No cervical or supraclavicular adenopathy. Thyroid normal.  Lungs:  Clear without wheezing, rales or rhonchi Cardiac: RR, without RMG Abdominal:  Soft, nontender, without masses, guarding, rebound, organomegaly or hernia Breasts:  Examined lying and sitting without masses, retractions, discharge or axillary adenopathy. Pelvic:  Ext, BUS, Vagina: Normal  Cervix: Normal  Uterus: Anteverted, normal size, shape and contour, midline and mobile nontender   Adnexa: Without masses or tenderness    Anus and perineum: Normal   Rectovaginal: Normal sphincter tone without palpated masses or tenderness.    Assessment/Plan:  41 y.o. [id number] female for annual gynecologic exam with regular menses, condom contraception.   1. Contraception.  Husband planning vasectomy.  I stressed the importance of follow-up semen analysis afterwards to assure azoospermia. 2. Mammography scheduled and patient will follow-up for this.  Breast exam normal today.  SBE monthly reviewed. 3. Pap smear/HPV 01/2014.  No Pap smear done today.  No history of significant  abnormal Pap smears.  Plan repeat Pap smear at 5-year interval per current screening guidelines. 4. Health maintenance.  Patient recently reports routine lab work at her other physicians office.  Follow-up in 1 year, sooner as needed.   Anastasio Auerbach MD, 4:18 PM 10/31/2017

## 2017-11-29 ENCOUNTER — Inpatient Hospital Stay: Admission: RE | Admit: 2017-11-29 | Payer: BC Managed Care – PPO | Source: Ambulatory Visit

## 2017-12-10 ENCOUNTER — Encounter: Payer: BC Managed Care – PPO | Admitting: Women's Health

## 2017-12-20 ENCOUNTER — Emergency Department (HOSPITAL_BASED_OUTPATIENT_CLINIC_OR_DEPARTMENT_OTHER)
Admission: EM | Admit: 2017-12-20 | Discharge: 2017-12-21 | Disposition: A | Discharge status: Home / Self Care | Payer: No Typology Code available for payment source | Attending: Emergency Medicine | Admitting: Emergency Medicine

## 2017-12-20 ENCOUNTER — Other Ambulatory Visit: Payer: Self-pay

## 2017-12-20 ENCOUNTER — Emergency Department (HOSPITAL_BASED_OUTPATIENT_CLINIC_OR_DEPARTMENT_OTHER): Payer: No Typology Code available for payment source

## 2017-12-20 ENCOUNTER — Encounter (HOSPITAL_BASED_OUTPATIENT_CLINIC_OR_DEPARTMENT_OTHER): Payer: Self-pay

## 2017-12-20 DIAGNOSIS — Y939 Activity, unspecified: Secondary | ICD-10-CM | POA: Diagnosis not present

## 2017-12-20 DIAGNOSIS — Y999 Unspecified external cause status: Secondary | ICD-10-CM | POA: Diagnosis not present

## 2017-12-20 DIAGNOSIS — Z79899 Other long term (current) drug therapy: Secondary | ICD-10-CM | POA: Insufficient documentation

## 2017-12-20 DIAGNOSIS — Y929 Unspecified place or not applicable: Secondary | ICD-10-CM | POA: Insufficient documentation

## 2017-12-20 DIAGNOSIS — S3992XA Unspecified injury of lower back, initial encounter: Secondary | ICD-10-CM | POA: Diagnosis present

## 2017-12-20 DIAGNOSIS — S39012A Strain of muscle, fascia and tendon of lower back, initial encounter: Secondary | ICD-10-CM

## 2017-12-20 LAB — PREGNANCY, URINE: Preg Test, Ur: NEGATIVE

## 2017-12-20 MED ORDER — CYCLOBENZAPRINE HCL 10 MG PO TABS
10.0000 mg | ORAL_TABLET | Freq: Once | ORAL | Status: AC
Start: 2017-12-20 — End: 2017-12-20
  Administered 2017-12-20: 10 mg via ORAL
  Filled 2017-12-20: qty 1

## 2017-12-20 MED ORDER — CYCLOBENZAPRINE HCL 10 MG PO TABS: 10.0000 mg | ORAL_TABLET | Freq: Two times a day (BID) | ORAL | 0 refills | Status: DC | PRN

## 2017-12-20 MED ORDER — ACETAMINOPHEN 325 MG PO TABS
650.0000 mg | ORAL_TABLET | Freq: Once | ORAL | Status: AC
  Administered 2017-12-20: 650 mg via ORAL
  Filled 2017-12-20: qty 2

## 2017-12-20 MED ORDER — NAPROXEN 375 MG PO TABS: 375.0000 mg | ORAL_TABLET | Freq: Two times a day (BID) | ORAL | 0 refills | Status: DC

## 2017-12-20 MED ORDER — IBUPROFEN 400 MG PO TABS
600.0000 mg | ORAL_TABLET | Freq: Once | ORAL | Status: AC
  Administered 2017-12-20: 600 mg via ORAL
  Filled 2017-12-20: qty 1
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
## 2018-01-17 DIAGNOSIS — M21612 Bunion of left foot: Secondary | ICD-10-CM

## 2018-01-17 HISTORY — DX: Bunion of left foot: M21.612

## 2018-01-27 ENCOUNTER — Other Ambulatory Visit: Payer: Self-pay | Admitting: Orthopedic Surgery

## 2018-02-13 ENCOUNTER — Other Ambulatory Visit: Payer: Self-pay

## 2018-02-13 ENCOUNTER — Encounter (HOSPITAL_BASED_OUTPATIENT_CLINIC_OR_DEPARTMENT_OTHER): Payer: Self-pay | Admitting: *Deleted

## 2018-02-20 ENCOUNTER — Encounter (HOSPITAL_BASED_OUTPATIENT_CLINIC_OR_DEPARTMENT_OTHER): Payer: Self-pay | Admitting: Certified Registered"

## 2018-02-20 ENCOUNTER — Ambulatory Visit (HOSPITAL_BASED_OUTPATIENT_CLINIC_OR_DEPARTMENT_OTHER): Payer: BC Managed Care – PPO | Admitting: Certified Registered"

## 2018-02-20 ENCOUNTER — Ambulatory Visit (HOSPITAL_BASED_OUTPATIENT_CLINIC_OR_DEPARTMENT_OTHER)
Admission: RE | Admit: 2018-02-20 | Discharge: 2018-02-20 | Disposition: A | Discharge status: Home / Self Care | Payer: BC Managed Care – PPO | Source: Ambulatory Visit | Attending: Orthopedic Surgery | Admitting: Orthopedic Surgery

## 2018-02-20 ENCOUNTER — Other Ambulatory Visit: Payer: Self-pay

## 2018-02-20 ENCOUNTER — Encounter (HOSPITAL_BASED_OUTPATIENT_CLINIC_OR_DEPARTMENT_OTHER)
Admission: RE | Disposition: A | Discharge status: Home / Self Care | Payer: Self-pay | Source: Ambulatory Visit | Attending: Orthopedic Surgery

## 2018-02-20 DIAGNOSIS — Z8249 Family history of ischemic heart disease and other diseases of the circulatory system: Secondary | ICD-10-CM | POA: Insufficient documentation

## 2018-02-20 DIAGNOSIS — Z6841 Body Mass Index (BMI) 40.0 and over, adult: Secondary | ICD-10-CM | POA: Diagnosis not present

## 2018-02-20 DIAGNOSIS — Z807 Family history of other malignant neoplasms of lymphoid, hematopoietic and related tissues: Secondary | ICD-10-CM | POA: Insufficient documentation

## 2018-02-20 DIAGNOSIS — Z833 Family history of diabetes mellitus: Secondary | ICD-10-CM | POA: Insufficient documentation

## 2018-02-20 DIAGNOSIS — Z801 Family history of malignant neoplasm of trachea, bronchus and lung: Secondary | ICD-10-CM | POA: Diagnosis not present

## 2018-02-20 DIAGNOSIS — M2012 Hallux valgus (acquired), left foot: Secondary | ICD-10-CM | POA: Diagnosis present

## 2018-02-20 DIAGNOSIS — Z800 Family history of malignant neoplasm of digestive organs: Secondary | ICD-10-CM | POA: Diagnosis not present

## 2018-02-20 DIAGNOSIS — M1990 Unspecified osteoarthritis, unspecified site: Secondary | ICD-10-CM | POA: Insufficient documentation

## 2018-02-20 DIAGNOSIS — Z808 Family history of malignant neoplasm of other organs or systems: Secondary | ICD-10-CM | POA: Diagnosis not present

## 2018-02-20 DIAGNOSIS — Z79899 Other long term (current) drug therapy: Secondary | ICD-10-CM | POA: Diagnosis not present

## 2018-02-20 HISTORY — DX: Bunion of left foot: M21.612

## 2018-02-20 HISTORY — PX: BUNIONECTOMY WITH TARSAL MEDITARSAL FUSION: SHX5602

## 2018-02-20 SURGERY — BUNIONECTOMY, WITH JOINT FUSION
Anesthesia: General | Site: Foot | Laterality: Left

## 2018-02-20 MED ORDER — PROPOFOL 500 MG/50ML IV EMUL
INTRAVENOUS | Status: AC
  Filled 2018-02-20: qty 50

## 2018-02-20 MED ORDER — PROMETHAZINE HCL 25 MG/ML IJ SOLN: 6.2500 mg | INTRAMUSCULAR | Status: DC | PRN

## 2018-02-20 MED ORDER — MIDAZOLAM HCL 2 MG/2ML IJ SOLN
INTRAMUSCULAR | Status: AC
  Filled 2018-02-20: qty 2

## 2018-02-20 MED ORDER — DEXAMETHASONE SODIUM PHOSPHATE 10 MG/ML IJ SOLN
INTRAMUSCULAR | Status: DC | PRN
  Administered 2018-02-20: 10 mg via INTRAVENOUS

## 2018-02-20 MED ORDER — CEFAZOLIN SODIUM-DEXTROSE 2-4 GM/100ML-% IV SOLN
INTRAVENOUS | Status: AC
  Filled 2018-02-20: qty 100

## 2018-02-20 MED ORDER — FENTANYL CITRATE (PF) 100 MCG/2ML IJ SOLN
INTRAMUSCULAR | Status: AC
  Filled 2018-02-20: qty 2

## 2018-02-20 MED ORDER — FENTANYL CITRATE (PF) 100 MCG/2ML IJ SOLN: 25.0000 ug | INTRAMUSCULAR | Status: DC | PRN

## 2018-02-20 MED ORDER — LIDOCAINE HCL (CARDIAC) 20 MG/ML IV SOLN
INTRAVENOUS | Status: DC | PRN
  Administered 2018-02-20: 30 mg via INTRAVENOUS

## 2018-02-20 MED ORDER — PROPOFOL 10 MG/ML IV BOLUS
INTRAVENOUS | Status: DC | PRN
  Administered 2018-02-20: 200 mg via INTRAVENOUS

## 2018-02-20 MED ORDER — FENTANYL CITRATE (PF) 100 MCG/2ML IJ SOLN
50.0000 ug | INTRAMUSCULAR | Status: DC | PRN
  Administered 2018-02-20: 100 ug via INTRAVENOUS

## 2018-02-20 MED ORDER — MIDAZOLAM HCL 2 MG/2ML IJ SOLN
1.0000 mg | INTRAMUSCULAR | Status: DC | PRN
  Administered 2018-02-20: 2 mg via INTRAVENOUS

## 2018-02-20 MED ORDER — BUPIVACAINE-EPINEPHRINE (PF) 0.5% -1:200000 IJ SOLN
INTRAMUSCULAR | Status: DC | PRN
  Administered 2018-02-20: 30 mL via PERINEURAL

## 2018-02-20 MED ORDER — LACTATED RINGERS IV SOLN
INTRAVENOUS | Status: DC
  Administered 2018-02-20 (×2): via INTRAVENOUS

## 2018-02-20 MED ORDER — CHLORHEXIDINE GLUCONATE 4 % EX LIQD: 60.0000 mL | Freq: Once | CUTANEOUS | Status: DC

## 2018-02-20 MED ORDER — SCOPOLAMINE 1 MG/3DAYS TD PT72: 1.0000 | MEDICATED_PATCH | Freq: Once | TRANSDERMAL | Status: DC | PRN

## 2018-02-20 MED ORDER — ONDANSETRON HCL 4 MG/2ML IJ SOLN
INTRAMUSCULAR | Status: DC | PRN
  Administered 2018-02-20: 4 mg via INTRAVENOUS

## 2018-02-20 MED ORDER — SODIUM CHLORIDE 0.9 % IV SOLN: INTRAVENOUS | Status: DC

## 2018-02-20 MED ORDER — VANCOMYCIN HCL 500 MG IV SOLR
INTRAVENOUS | Status: AC
  Filled 2018-02-20: qty 1000

## 2018-02-20 MED ORDER — PROPOFOL 10 MG/ML IV BOLUS
INTRAVENOUS | Status: AC
  Filled 2018-02-20: qty 20

## 2018-02-20 MED ORDER — OXYCODONE HCL 5 MG PO TABS: 5.0000 mg | ORAL_TABLET | ORAL | 0 refills | Status: DC | PRN

## 2018-02-20 MED ORDER — CEFAZOLIN SODIUM-DEXTROSE 2-4 GM/100ML-% IV SOLN
2.0000 g | INTRAVENOUS | Status: AC
  Administered 2018-02-20: 3 g via INTRAVENOUS

## 2018-02-20 MED ORDER — CEFAZOLIN SODIUM-DEXTROSE 1-4 GM/50ML-% IV SOLN
INTRAVENOUS | Status: AC
  Filled 2018-02-20: qty 50

## 2018-02-20 SURGICAL SUPPLY — 72 items
BANDAGE ESMARK 6X9 LF (GAUZE/BANDAGES/DRESSINGS) IMPLANT
BIT DRILL 2.4 AO COUPLING CANN (BIT) ×4 IMPLANT
BLADE AVERAGE 25MMX9MM (BLADE) ×1
BLADE AVERAGE 25X9 (BLADE) ×3 IMPLANT
BLADE LONG MED 25X9 (BLADE) IMPLANT
BLADE LONG MED 25X9MM (BLADE)
BLADE MICRO SAGITTAL (BLADE) ×4 IMPLANT
BLADE SURG 15 STRL LF DISP TIS (BLADE) ×4 IMPLANT
BLADE SURG 15 STRL SS (BLADE) ×6
BNDG COHESIVE 4X5 TAN STRL (GAUZE/BANDAGES/DRESSINGS) ×4 IMPLANT
BNDG COHESIVE 6X5 TAN STRL LF (GAUZE/BANDAGES/DRESSINGS) ×4 IMPLANT
BNDG CONFORM 3 STRL LF (GAUZE/BANDAGES/DRESSINGS) ×4 IMPLANT
BNDG ESMARK 6X9 LF (GAUZE/BANDAGES/DRESSINGS)
CHLORAPREP W/TINT 26ML (MISCELLANEOUS) ×4 IMPLANT
COVER BACK TABLE 60X90IN (DRAPES) ×4 IMPLANT
CUFF TOURNIQUET SINGLE 24IN (TOURNIQUET CUFF) IMPLANT
CUFF TOURNIQUET SINGLE 34IN LL (TOURNIQUET CUFF) IMPLANT
CUFF TOURNIQUET SINGLE 44IN (TOURNIQUET CUFF) ×4 IMPLANT
DRAPE EXTREMITY T 121X128X90 (DRAPE) ×4 IMPLANT
DRAPE OEC MINIVIEW 54X84 (DRAPES) ×4 IMPLANT
DRAPE U-SHAPE 47X51 STRL (DRAPES) ×4 IMPLANT
DRSG MEPITEL 4X7.2 (GAUZE/BANDAGES/DRESSINGS) ×4 IMPLANT
DRSG PAD ABDOMINAL 8X10 ST (GAUZE/BANDAGES/DRESSINGS) ×4 IMPLANT
ELECT REM PT RETURN 9FT ADLT (ELECTROSURGICAL) ×4
ELECTRODE REM PT RTRN 9FT ADLT (ELECTROSURGICAL) ×2 IMPLANT
GAUZE SPONGE 4X4 12PLY STRL (GAUZE/BANDAGES/DRESSINGS) ×4 IMPLANT
GLOVE BIO SURGEON STRL SZ8 (GLOVE) ×4 IMPLANT
GLOVE BIOGEL PI IND STRL 6.5 (GLOVE) ×2 IMPLANT
GLOVE BIOGEL PI IND STRL 7.0 (GLOVE) ×2 IMPLANT
GLOVE BIOGEL PI IND STRL 8 (GLOVE) ×4 IMPLANT
GLOVE BIOGEL PI INDICATOR 6.5 (GLOVE) ×2
GLOVE BIOGEL PI INDICATOR 7.0 (GLOVE) ×2
GLOVE BIOGEL PI INDICATOR 8 (GLOVE) ×4
GLOVE ECLIPSE 6.5 STRL STRAW (GLOVE) ×4 IMPLANT
GLOVE ECLIPSE 8.0 STRL XLNG CF (GLOVE) ×4 IMPLANT
GLOVE SURG SS PI 6.5 STRL IVOR (GLOVE) ×4 IMPLANT
GOWN STRL REUS W/ TWL LRG LVL3 (GOWN DISPOSABLE) ×4 IMPLANT
GOWN STRL REUS W/ TWL XL LVL3 (GOWN DISPOSABLE) ×4 IMPLANT
GOWN STRL REUS W/TWL LRG LVL3 (GOWN DISPOSABLE) ×4
GOWN STRL REUS W/TWL XL LVL3 (GOWN DISPOSABLE) ×6
KIT ARCUS SIZING TEMPLATE STRL (KITS) IMPLANT
NEEDLE HYPO 25X1 1.5 SAFETY (NEEDLE) IMPLANT
NS IRRIG 1000ML POUR BTL (IV SOLUTION) ×4 IMPLANT
PACK BASIN DAY SURGERY FS (CUSTOM PROCEDURE TRAY) ×4 IMPLANT
PAD CAST 4YDX4 CTTN HI CHSV (CAST SUPPLIES) ×2 IMPLANT
PADDING CAST ABS 4INX4YD NS (CAST SUPPLIES)
PADDING CAST ABS COTTON 4X4 ST (CAST SUPPLIES) IMPLANT
PADDING CAST COTTON 4X4 STRL (CAST SUPPLIES) ×2
PENCIL BUTTON HOLSTER BLD 10FT (ELECTRODE) ×4 IMPLANT
SANITIZER HAND PURELL 535ML FO (MISCELLANEOUS) ×4 IMPLANT
SCREW CANN 4.0X38MM (Screw) ×4 IMPLANT
SCREW T15 LP CORT 3.5X50MM NS (Screw) ×4 IMPLANT
SHEET MEDIUM DRAPE 40X70 STRL (DRAPES) ×4 IMPLANT
SLEEVE SCD COMPRESS KNEE MED (MISCELLANEOUS) ×4 IMPLANT
SPONGE LAP 18X18 RF (DISPOSABLE) ×4 IMPLANT
STOCKINETTE 6  STRL (DRAPES) ×2
STOCKINETTE 6 STRL (DRAPES) ×2 IMPLANT
SUCTION FRAZIER HANDLE 10FR (MISCELLANEOUS)
SUCTION TUBE FRAZIER 10FR DISP (MISCELLANEOUS) IMPLANT
SUT ETHIBOND 3-0 V-5 (SUTURE) IMPLANT
SUT ETHILON 3 0 PS 1 (SUTURE) ×4 IMPLANT
SUT MNCRL AB 3-0 PS2 18 (SUTURE) ×4 IMPLANT
SUT VIC AB 0 SH 27 (SUTURE) IMPLANT
SUT VIC AB 2-0 SH 27 (SUTURE)
SUT VIC AB 2-0 SH 27XBRD (SUTURE) IMPLANT
SUT VICRYL 0 UR6 27IN ABS (SUTURE) IMPLANT
SYR BULB 3OZ (MISCELLANEOUS) ×4 IMPLANT
SYR CONTROL 10ML LL (SYRINGE) IMPLANT
TOWEL OR 17X24 6PK STRL BLUE (TOWEL DISPOSABLE) ×4 IMPLANT
TUBE CONNECTING 20'X1/4 (TUBING)
TUBE CONNECTING 20X1/4 (TUBING) IMPLANT
UNDERPAD 30X30 (UNDERPADS AND DIAPERS) ×4 IMPLANT

## 2018-02-20 NOTE — Op Note (Signed)
02/20/2018  10:18 AM  PATIENT:  [name]  42 y.o. female  PRE-OPERATIVE DIAGNOSIS:  Painful left foot bunion  POST-OPERATIVE DIAGNOSIS:  same  Procedure(s): 1.  Left modified McBride bunionectomy   2.  Left Lapidus arthrodesis of the 1st TMT joint   3.  AP, oblique and lateral xrays of the left foot   SURGEON:  Wylene Simmer, MD  ASSISTANT: none  ANESTHESIA:   General, regional  EBL:  minimal   TOURNIQUET:   Total Tourniquet Time Documented: Thigh (Left) - 45 minutes Total: Thigh (Left) - 45 minutes  COMPLICATIONS:  None apparent  DISPOSITION:  Extubated, awake and stable to recovery.  INDICATION FOR PROCEDURE: The patient is a 42 year old female without significant past medical history.  She has a painful bunion deformity and has failed nonoperative treatment to date.  She presents today for operative correction of this painful and limiting foot deformity.  The risks and benefits of the alternative treatment options have been discussed in detail.  The patient wishes to proceed with surgery and specifically understands risks of bleeding, infection, nerve damage, blood clots, need for additional surgery, amputation and death.  PROCEDURE IN DETAIL:  After pre operative consent was obtained, and the correct operative site was identified, the patient was brought to the operating room and placed supine on the OR table.  Anesthesia was administered.  Pre-operative antibiotics were administered.  A surgical timeout was taken.  The left lower extremity was prepped and draped in standard sterile fashion with a tourniquet around the thigh.  The extremity was elevated and the tourniquet was inflated to 350 mmHg.  A longitudinal incision was made over the dorsum of the first web space.  Dissection was carried down through the subcutaneous tissues.  The intermetatarsal ligament was divided under direct vision.  An arthrotomy was made between the lateral sesamoid and the first metatarsal head.   Small perforations were made in the lateral joint capsule.  The hallux could then be positioned in 20 degrees of varus passively.  Attention was then turned to the medial eminence where a longitudinal incision was made.  Dissection was carried down through to the medial joint capsule.  It was incised and elevated plantarly and dorsally.  The hypertrophic medial eminence was then resected in line with the first metatarsal shaft.  Attention was then turned to the dorsum of the midfoot where a longitudinal incision was made over the first tarsometatarsal joint.  Dissection was carried down to the joint capsule.  The extensor hallucis longus tendon was protected throughout the case.  The first tarsometatarsal joint was opened.  The remaining articular cartilage and subchondral bone was resected from both sides of the joint taking more bone laterally than medially in order to correct the intermetatarsal angle.  The joint was irrigated and perforated on both sides with a small drill bit.  The joint was reduced and provisionally pinned.  AP and lateral radiographs revealed appropriate correction of the intermetatarsal and hallux valgus angles.  The arthrodesis was then fixed with a 4 mm cannulated screw from the [employer] small frag set.  A 3.5 mm fully threaded LPS screw was inserted from distal to proximal.  The screw was from the new LPS set.  AP and lateral radiographs confirmed appropriate position and length of all hardware.  The wound was irrigated copiously.  The medial joint capsule was repaired with 2-0 Vicryl imbricating sutures.  Subtenons tissues were approximated with Monocryl and the skin incisions were closed  with nylon.  Sterile dressings were applied followed by a well-padded short leg splint.  The tourniquet was released after application of the dressings.  The patient was awakened from anesthesia and transported to the recovery room in stable condition.   FOLLOW UP PLAN: Nonweightbearing on  the left lower extremity.  Follow-up in 2 weeks for suture removal and conversion to a short leg cast.  Aspirin 81 mg p.o. twice daily for DVT prophylaxis.  RADIOGRAPHS: AP, oblique and lateral radiographs of the left foot are obtained intraoperatively.  These show interval arthrodesis of the first tarsometatarsal joint.  Appropriate correction of the hallux valgus and intermetatarsal angles is noted.  Hardware is appropriately positioned and of the appropriate lengths.  No other acute injuries.

## 2018-02-20 NOTE — Anesthesia Preprocedure Evaluation (Addendum)
Anesthesia Evaluation  Patient identified by MRN, date of birth, ID band Patient awake    Reviewed: Allergy & Precautions, NPO status , Patient's Chart, lab work & pertinent test results  Airway Mallampati: II  TM Distance: >3 FB Neck ROM: Full    Dental  (+) Teeth Intact, Dental Advisory Given   Pulmonary neg pulmonary ROS,    Pulmonary exam normal breath sounds clear to auscultation       Cardiovascular negative cardio ROS Normal cardiovascular exam Rhythm:Regular Rate:Normal     Neuro/Psych negative neurological ROS     GI/Hepatic negative GI ROS, Neg liver ROS,   Endo/Other  Morbid obesity  Renal/GU negative Renal ROS     Musculoskeletal  (+) Arthritis , Left bunion   Abdominal   Peds  Hematology negative hematology ROS (+)   Anesthesia Other Findings Day of surgery medications reviewed with the patient.  Reproductive/Obstetrics negative OB ROS                             Anesthesia Physical Anesthesia Plan  ASA: III  Anesthesia Plan: General   Post-op Pain Management:  Regional for Post-op pain   Induction: Intravenous  PONV Risk Score and Plan: 3 and Midazolam, Dexamethasone and Ondansetron  Airway Management Planned: LMA  Additional Equipment:   Intra-op Plan:   Post-operative Plan: Extubation in OR  Informed Consent: I have reviewed the patients History and Physical, chart, labs and discussed the procedure including the risks, benefits and alternatives for the proposed anesthesia with the patient or authorized representative who has indicated his/her understanding and acceptance.   Dental advisory given  Plan Discussed with: CRNA  Anesthesia Plan Comments:         Anesthesia Quick Evaluation

## 2018-02-20 NOTE — Transfer of Care (Signed)
Immediate Anesthesia Transfer of Care Note  Patient: [name]  Procedure(s) Performed: Left Lapidus; modified McBride Bunionectomy (Left Foot)  Patient Location: PACU  Anesthesia Type:GA combined with regional for post-op pain  Level of Consciousness: awake and patient cooperative  Airway & Oxygen Therapy: Patient Spontanous Breathing and Patient connected to face mask oxygen  Post-op Assessment: Report given to RN and Post -op Vital signs reviewed and stable  Post vital signs: Reviewed and stable  Last Vitals:  Vitals Value Taken Time  BP 131/80 02/20/2018 10:04 AM  Temp    Pulse 73 02/20/2018 10:06 AM  Resp 17 02/20/2018 10:06 AM  SpO2 100 % 02/20/2018 10:06 AM  Vitals shown include unvalidated device data.  Last Pain:  Vitals:   02/20/18 0746  TempSrc: Oral  PainSc: 0-No pain         Complications: No apparent anesthesia complications

## 2018-02-20 NOTE — Anesthesia Postprocedure Evaluation (Signed)
Anesthesia Post Note  Patient: [name]  Procedure(s) Performed: Left Lapidus; modified McBride Bunionectomy (Left Foot)     Patient location during evaluation: PACU Anesthesia Type: General Level of consciousness: awake and alert Pain management: pain level controlled Vital Signs Assessment: post-procedure vital signs reviewed and stable Respiratory status: spontaneous breathing, nonlabored ventilation and respiratory function stable Cardiovascular status: blood pressure returned to baseline and stable Postop Assessment: no apparent nausea or vomiting Anesthetic complications: no    Last Vitals:  Vitals:   02/20/18 1045 02/20/18 1130  BP: 114/76 130/90  Pulse: 74 68  Resp: 18 18  Temp:  36.5 C  SpO2: 100% 100%    Last Pain:  Vitals:   02/20/18 1130  TempSrc:   PainSc: 0-No pain                 [name]

## 2018-02-20 NOTE — Progress Notes (Signed)
Assisted Dr. Turk with left, ultrasound guided, popliteal block. Side rails up, monitors on throughout procedure. See vital signs in flow sheet. Tolerated Procedure well. 

## 2018-02-20 NOTE — Anesthesia Procedure Notes (Signed)
Anesthesia Regional Block: Popliteal block   Pre-Anesthetic Checklist: ,, timeout performed, Correct Patient, Correct Site, Correct Laterality, Correct Procedure, Correct Position, site marked, Risks and benefits discussed,  Surgical consent,  Pre-op evaluation,  At surgeon's request and post-op pain management  Laterality: Left  Prep: chloraprep       Needles:  Injection technique: Single-shot  Needle Type: Echogenic Needle     Needle Length: 9cm  Needle Gauge: 21     Additional Needles:   Procedures:,,,, ultrasound used (permanent image in chart),,,,  Narrative:  Start time: 02/20/2018 8:06 AM End time: 02/20/2018 8:12 AM Injection made incrementally with aspirations every 5 mL.  Performed by: Personally  Anesthesiologist: [name], MD  Additional Notes: No pain on injection. No increased resistance to injection. Injection made in 5cc increments.  Good needle visualization.  Patient tolerated procedure well.  Left popliteal block.

## 2018-02-20 NOTE — Discharge Instructions (Addendum)
Wylene Simmer, MD [place]  Please read the following information regarding your care after surgery.  Medications  You only need a prescription for the narcotic pain medicine (ex. oxycodone, Percocet, Norco).  All of the other medicines listed below are available over the counter. X Aleve 2 pills twice a day for the first 3 days after surgery. X acetominophen (Tylenol) 650 mg every 4-6 hours as you need for minor to moderate pain X oxycodone as prescribed for severe pain  Narcotic pain medicine (ex. oxycodone, Percocet, Vicodin) will cause constipation.  To prevent this problem, take the following medicines while you are taking any pain medicine. X docusate sodium (Colace) 100 mg twice a day X senna (Senokot) 2 tablets twice a day  X To help prevent blood clots, take a baby aspirin (81 mg) twice a day for two weeks after surgery.  You should also get up every hour while you are awake to move around.    Weight Bearing ? Bear weight when you are able on your operated leg or foot. ? Bear weight only on your operated foot in the post-op shoe. X Do not bear any weight on the operated leg or foot.  Cast / Splint / Dressing X Keep your splint, cast or dressing clean and dry.  Dont put anything (coat hanger, pencil, etc) down inside of it.  If it gets damp, use a hair dryer on the cool setting to dry it.  If it gets soaked, call the office to schedule an appointment for a cast change. ? Remove your dressing 3 days after surgery and cover the incisions with dry dressings.    After your dressing, cast or splint is removed; you may shower, but do not soak or scrub the wound.  Allow the water to run over it, and then gently pat it dry.  Swelling It is normal for you to have swelling where you had surgery.  To reduce swelling and pain, keep your toes above your nose for at least 3 days after surgery.  It may be necessary to keep your foot or leg elevated for several weeks.  If it hurts,  it should be elevated.  Follow Up Call my office at [phone] when you are discharged from the hospital or surgery center to schedule an appointment to be seen two weeks after surgery.  Call my office at [phone] if you develop a fever >101.5 F, nausea, vomiting, bleeding from the surgical site or severe pain.        Regional Anesthesia Blocks  1. Numbness or the inability to move the "blocked" extremity may last from 3-48 hours after placement. The length of time depends on the medication injected and your individual response to the medication. If the numbness is not going away after 48 hours, call your surgeon.  2. The extremity that is blocked will need to be protected until the numbness is gone and the  Strength has returned. Because you cannot feel it, you will need to take extra care to avoid injury. Because it may be weak, you may have difficulty moving it or using it. You may not know what position it is in without looking at it while the block is in effect.  3. For blocks in the legs and feet, returning to weight bearing and walking needs to be done carefully. You will need to wait until the numbness is entirely gone and the strength has returned. You should be able to move your leg and foot normally  before you try and bear weight or walk. You will need someone to be with you when you first try to ensure you do not fall and possibly risk injury.  4. Bruising and tenderness at the needle site are common side effects and will resolve in a few days.  5. Persistent numbness or new problems with movement should be communicated to the surgeon or the [place] [phone] [place] [phone]).       Post Anesthesia Home Care Instructions  Activity: Get plenty of rest for the remainder of the day. A responsible individual must stay with you for 24 hours following the procedure.  For the next 24 hours, DO NOT: -Drive a car -Conservation officer, nature -Drink alcoholic beverages -Take any medication unless instructed by your physician -Make any legal decisions or sign important papers.  Meals: Start with liquid foods such as gelatin or soup. Progress to regular foods as tolerated. Avoid greasy, spicy, heavy foods. If nausea and/or vomiting occur, drink only clear liquids until the nausea and/or vomiting subsides. Call your physician if vomiting continues.  Special Instructions/Symptoms: Your throat may feel dry or sore from the anesthesia or the breathing tube placed in your throat during surgery. If this causes discomfort, gargle with warm salt water. The discomfort should disappear within 24 hours.  If you had a scopolamine patch placed behind your ear for the management of post- operative nausea and/or vomiting:  1. The medication in the patch is effective for 72 hours, after which it should be removed.  Wrap patch in a tissue and discard in the trash. Wash hands thoroughly with soap and water. 2. You may remove the patch earlier than 72 hours if you experience unpleasant side effects which may include dry mouth, dizziness or visual disturbances. 3. Avoid touching the patch. Wash your hands with soap and water after contact with the patch.

## 2018-02-20 NOTE — H&P (Signed)
[name] is an 42 y.o. female.   Chief Complaint: left foot pain HPI: The patient is a 42 year old female without significant past medical history.  She has a long history of left forefoot pain due to a large bunion.  She has failed nonoperative treatment to date including activity modification, oral anti-inflammatories and shoe wear modification.  She presents today for operative treatment of this painful and limiting condition.  Past Medical History:  Diagnosis Date  . Arthritis    knees, spine  . Bunion of left foot 01/2018    Past Surgical History:  Procedure Laterality Date  . BACK SURGERY     lumbar cyst excision  . BUNIONECTOMY Right   . CESAREAN SECTION  04/24/2005  . CESAREAN SECTION  06/09/2012   Procedure: CESAREAN SECTION;  Surgeon: Sharene Butters, MD;  Location: [facility];  Service: Gynecology;  Laterality: N/A;  Repeat Cesarean Section Delivery Girl @ 1630, Apgars 9/9  . DILATION AND EVACUATION  02/28/2011  . EXPLORATORY LAPAROTOMY  04/25/2004   with myomectomy  . fibroidectomy    . WISDOM TOOTH EXTRACTION      Family History  Problem Relation Age of Onset  . Hypertension Father   . Hypercholesterolemia Father   . Diabetes Father   . Cancer Maternal Grandmother        pancreatic  . Cancer Maternal Grandfather        bone  . Cancer Paternal Uncle        Lymphoma  . Cancer Paternal Grandfather        Lung   Social History:  reports that she has never smoked. She has never used smokeless tobacco. She reports that she drinks alcohol. She reports that she does not use drugs.  Allergies: No Known Allergies  Medications Prior to Admission  Medication Sig Dispense Refill  . cholecalciferol (VITAMIN D) 1000 units tablet Take 10,000 Units by mouth daily.    . meloxicam (MOBIC) 15 MG tablet Take 1 mg by mouth daily.     . Multiple Vitamin (MULTIVITAMIN) tablet 1 tablet by Combination route daily.    Marland Kitchen omega-3 acid ethyl esters (LOVAZA) 1 g capsule Take 1 g by  mouth 2 (two) times daily.      No results found for this or any previous visit (from the past 48 hour(s)). No results found.  ROS no recent fever, chills, nausea, vomiting or changes in her appetite  Blood pressure (!) 131/92, pulse 79, temperature 98 F (36.7 C), temperature source Oral, resp. rate 20, height 5\' 6"  (1.676 m), weight 125.3 kg (276 lb 3.2 oz), last menstrual period 02/06/2018, SpO2 100 %. Physical Exam  Well-nourished well-developed woman in no apparent distress.  Alert and oriented x4.  Mood and affect are normal.  Extraocular motions are intact.  Respirations are unlabored.  Gait is normal.  The left foot has a large bunion deformity.  Skin is healthy and intact.  No lymphadenopathy.  Pulses are palpable.  5 out of 5 strength in plantar flexion and dorsiflexion of the ankle and toes.  Sensibility to light touch is intact at the forefoot dorsally and plantarly.  Assessment/Plan Left foot bunion deformity -to the operating room today for Lapidus arthrodesis of the first tarsometatarsal joint and modified McBride bunionectomy.The risks and benefits of the alternative treatment options have been discussed in detail.  The patient wishes to proceed with surgery and specifically understands risks of bleeding, infection, nerve damage, blood clots, need for additional surgery, amputation and  deathWylene Simmer, MD 02/20/2018, 8:37 AM

## 2018-02-20 NOTE — Anesthesia Procedure Notes (Addendum)
Procedure Name: LMA Insertion Date/Time: 02/20/2018 8:50 AM Performed by: Signe Colt, CRNA Pre-anesthesia Checklist: Patient identified, Emergency Drugs available, Suction available and Patient being monitored Patient Re-evaluated:Patient Re-evaluated prior to induction Oxygen Delivery Method: Circle system utilized Preoxygenation: Pre-oxygenation with 100% oxygen Induction Type: IV induction Ventilation: Mask ventilation without difficulty LMA: LMA inserted LMA Size: 4.0 Number of attempts: 1 Airway Equipment and Method: Bite block Placement Confirmation: positive ETCO2 Tube secured with: Tape Dental Injury: Teeth and Oropharynx as per pre-operative assessment

## 2018-02-21 ENCOUNTER — Encounter (HOSPITAL_BASED_OUTPATIENT_CLINIC_OR_DEPARTMENT_OTHER): Payer: Self-pay | Admitting: Orthopedic Surgery

## 2018-11-04 ENCOUNTER — Encounter: Payer: BC Managed Care – PPO | Admitting: Gynecology

## 2018-11-04 DIAGNOSIS — Z0289 Encounter for other administrative examinations: Secondary | ICD-10-CM

## 2019-04-02 ENCOUNTER — Other Ambulatory Visit: Payer: Self-pay | Admitting: Nurse Practitioner

## 2019-04-02 DIAGNOSIS — Z1231 Encounter for screening mammogram for malignant neoplasm of breast: Secondary | ICD-10-CM

## 2019-08-11 ENCOUNTER — Encounter: Payer: Self-pay | Admitting: Gynecology

## 2019-12-22 ENCOUNTER — Other Ambulatory Visit: Payer: Self-pay

## 2019-12-23 ENCOUNTER — Encounter: Payer: Self-pay | Admitting: Women's Health

## 2019-12-23 ENCOUNTER — Ambulatory Visit: Payer: BC Managed Care – PPO | Admitting: Women's Health

## 2019-12-23 VITALS — BP 134/78 | Ht 66.0 in | Wt 291.0 lb

## 2019-12-23 DIAGNOSIS — Z01419 Encounter for gynecological examination (general) (routine) without abnormal findings: Secondary | ICD-10-CM

## 2019-12-23 NOTE — Progress Notes (Signed)
[name] 21-Dec-1975 [id number]    History:    Presents for annual exam.  Regular monthly cycle/vasectomy.  Normal Pap history.  Has not had a screening mammogram.  Continues to struggle with weight is contemplating bariatric surgery has appointment scheduled.  Primary care manages labs.  Past medical history, past surgical history, family history and social history were all reviewed and documented in the EPIC chart.  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  2 children ages 43-year-old son autism and 43 daughter doing well.  ROS:  A ROS was performed and pertinent positives and negatives are included.  Exam:  Vitals:   12/23/19 1549  BP: 134/78  Weight: 291 lb (132 kg)  Height: 5\' 6"  (1.676 m)   Body mass index is 46.97 kg/m.   General appearance:  Normal Thyroid:  Symmetrical, normal in size, without palpable masses or nodularity. Respiratory  Auscultation:  Clear without wheezing or rhonchi Cardiovascular  Auscultation:  Regular rate, without rubs, murmurs or gallops  Edema/varicosities:  Not grossly evident Abdominal  Soft,nontender, without masses, guarding or rebound.  Liver/spleen:  No organomegaly noted  Hernia:  None appreciated  Skin  Inspection:  Grossly normal   Breasts: Examined lying and sitting.     Right: Without masses, retractions, discharge or axillary adenopathy.     Left: Without masses, retractions, discharge or axillary adenopathy. Gentitourinary   Inguinal/mons:  Normal without inguinal adenopathy  External genitalia:  Normal  BUS/Urethra/Skene's glands:  Normal  Vagina:  Normal  Cervix:  Normal  Uterus:  normal in size, shape and contour.  Midline and mobile  Adnexa/parametria:     Rt: Without masses or tenderness.   Lt: Without masses or tenderness.  Anus and perineum: Normal  Digital rectal exam: Normal sphincter tone without palpated masses or tenderness  Assessment/Plan:  44 y.o. MBF G4, P2 for annual exam with no GYN  complaints.  Monthly cycle/vasectomy Obesity-has follow-up scheduled with bariatric surgeon Hypercholesteremia-primary care manages labs and meds Chronic knee pain-orthopedist managing  Plan: SBEs, reviewed importance of annual screening mammogram, breast center information given instructed to schedule.  Reviewed importance of regular exercise, calcium rich foods, vitamin D 1000 daily encouraged.  Is aware of importance of increasing exercise and decreasing calories/carbs, has had steroid knee injections for chronic knee pain.  Pap with HR HPV typing, new screening guidelines reviewed.    Huel Cote [facility], 4:06 PM 12/23/2019

## 2019-12-23 NOTE — Patient Instructions (Signed)
It was good to see you today Mammogram breast center [phone] [address] Vitamin D 1000 IUs daily Health Maintenance, Female Adopting a healthy lifestyle and getting preventive care are important in promoting health and wellness. Ask your health care provider about:  The right schedule for you to have regular tests and exams.  Things you can do on your own to prevent diseases and keep yourself healthy. What should I know about diet, weight, and exercise? Eat a healthy diet   Eat a diet that includes plenty of vegetables, fruits, low-fat dairy products, and lean protein.  Do not eat a lot of foods that are high in solid fats, added sugars, or sodium. Maintain a healthy weight Body mass index (BMI) is used to identify weight problems. It estimates body fat based on height and weight. Your health care provider can help determine your BMI and help you achieve or maintain a healthy weight. Get regular exercise Get regular exercise. This is one of the most important things you can do for your health. Most adults should:  Exercise for at least 150 minutes each week. The exercise should increase your heart rate and make you sweat (moderate-intensity exercise).  Do strengthening exercises at least twice a week. This is in addition to the moderate-intensity exercise.  Spend less time sitting. Even light physical activity can be beneficial. Watch cholesterol and blood lipids Have your blood tested for lipids and cholesterol at 44 years of age, then have this test every 5 years. Have your cholesterol levels checked more often if:  Your lipid or cholesterol levels are high.  You are older than 43 years of age.  You are at high risk for heart disease. What should I know about cancer screening? Depending on your health history and family history, you may need to have cancer screening at various ages. This may include screening for:  Breast cancer.  Cervical  cancer.  Colorectal cancer.  Skin cancer.  Lung cancer. What should I know about heart disease, diabetes, and high blood pressure? Blood pressure and heart disease  High blood pressure causes heart disease and increases the risk of stroke. This is more likely to develop in people who have high blood pressure readings, are of African descent, or are overweight.  Have your blood pressure checked: ? Every 3-5 years if you are 37-52 years of age. ? Every year if you are 97 years old or older. Diabetes Have regular diabetes screenings. This checks your fasting blood sugar level. Have the screening done:  Once every three years after age 58 if you are at a normal weight and have a low risk for diabetes.  More often and at a younger age if you are overweight or have a high risk for diabetes. What should I know about preventing infection? Hepatitis B If you have a higher risk for hepatitis B, you should be screened for this virus. Talk with your health care provider to find out if you are at risk for hepatitis B infection. Hepatitis C Testing is recommended for:  Everyone born from [birth year] through 1965.  Anyone with known risk factors for hepatitis C. Sexually transmitted infections (STIs)  Get screened for STIs, including gonorrhea and chlamydia, if: ? You are sexually active and are younger than 43 years of age. ? You are older than 43 years of age and your health care provider tells you that you are at risk for this type of infection. ? Your sexual activity has changed since  you were last screened, and you are at increased risk for chlamydia or gonorrhea. Ask your health care provider if you are at risk.  Ask your health care provider about whether you are at high risk for HIV. Your health care provider may recommend a prescription medicine to help prevent HIV infection. If you choose to take medicine to prevent HIV, you should first get tested for HIV. You should then be tested every 3  months for as long as you are taking the medicine. Pregnancy  If you are about to stop having your period (premenopausal) and you may become pregnant, seek counseling before you get pregnant.  Take 400 to 800 micrograms (mcg) of folic acid every day if you become pregnant.  Ask for birth control (contraception) if you want to prevent pregnancy. Osteoporosis and menopause Osteoporosis is a disease in which the bones lose minerals and strength with aging. This can result in bone fractures. If you are 77 years old or older, or if you are at risk for osteoporosis and fractures, ask your health care provider if you should:  Be screened for bone loss.  Take a calcium or vitamin D supplement to lower your risk of fractures.  Be given hormone replacement therapy (HRT) to treat symptoms of menopause. Follow these instructions at home: Lifestyle  Do not use any products that contain nicotine or tobacco, such as cigarettes, e-cigarettes, and chewing tobacco. If you need help quitting, ask your health care provider.  Do not use street drugs.  Do not share needles.  Ask your health care provider for help if you need support or information about quitting drugs. Alcohol use  Do not drink alcohol if: ? Your health care provider tells you not to drink. ? You are pregnant, may be pregnant, or are planning to become pregnant.  If you drink alcohol: ? Limit how much you use to 0-1 drink a day. ? Limit intake if you are breastfeeding.  Be aware of how much alcohol is in your drink. In the [place] one drink equals one 12 oz bottle of beer (355 mL), one 5 oz glass of wine (148 mL), or one 1 oz glass of hard liquor (44 mL). General instructions  Schedule regular health, dental, and eye exams.  Stay current with your vaccines.  Tell your health care provider if: ? You often feel depressed. ? You have ever been abused or do not feel safe at home. Summary  Adopting a healthy lifestyle and getting  preventive care are important in promoting health and wellness.  Follow your health care provider's instructions about healthy diet, exercising, and getting tested or screened for diseases.  Follow your health care provider's instructions on monitoring your cholesterol and blood pressure. This information is not intended to replace advice given to you by your health care provider. Make sure you discuss any questions you have with your health care provider. Document Revised: 10/29/2018 Document Reviewed: 10/29/2018 Elsevier Patient Education  2020 [employer].

## 2019-12-24 LAB — PAP, TP IMAGING W/ HPV RNA, RFLX HPV TYPE 16,18/45: HPV DNA High Risk: NOT DETECTED

## 2019-12-25 ENCOUNTER — Other Ambulatory Visit: Payer: Self-pay | Admitting: *Deleted

## 2019-12-25 MED ORDER — FLUCONAZOLE 150 MG PO TABS: 150.0000 mg | ORAL_TABLET | Freq: Once | ORAL | 0 refills | Status: AC

## 2020-01-16 ENCOUNTER — Ambulatory Visit: Payer: BC Managed Care – PPO | Attending: Internal Medicine

## 2020-01-16 DIAGNOSIS — Z23 Encounter for immunization: Secondary | ICD-10-CM | POA: Insufficient documentation

## 2020-01-16 NOTE — Progress Notes (Signed)
   Covid-19 Vaccination Clinic  Name:  [name]    MRN: [id number] DOB: 1976/06/16  01/16/2020  [name] was observed post Covid-19 immunization for 15 minutes without incidence. She was provided with Vaccine Information Sheet and instruction to access the V-Safe system.   [name] was instructed to call 911 with any severe reactions post vaccine: Marland Kitchen Difficulty breathing  . Swelling of your face and throat  . A fast heartbeat  . A bad rash all over your body  . Dizziness and weakness    Immunizations Administered    Name Date Dose VIS Date Route   Pfizer COVID-19 Vaccine 01/16/2020  6:38 PM 0.3 mL 10/30/2019 Intramuscular   Manufacturer: [place]   Lot: [id number]   [place]: [id number]

## 2020-01-18 ENCOUNTER — Other Ambulatory Visit: Payer: Self-pay | Admitting: General Surgery

## 2020-01-18 ENCOUNTER — Other Ambulatory Visit (HOSPITAL_COMMUNITY): Payer: Self-pay | Admitting: General Surgery

## 2020-01-19 ENCOUNTER — Other Ambulatory Visit: Payer: Self-pay

## 2020-01-19 ENCOUNTER — Encounter: Payer: Self-pay | Admitting: Skilled Nursing Facility1

## 2020-01-19 ENCOUNTER — Encounter: Payer: BC Managed Care – PPO | Attending: General Surgery | Admitting: Skilled Nursing Facility1

## 2020-01-19 NOTE — Progress Notes (Signed)
Nutrition Assessment for Bariatric Surgery Medical Nutrition Therapy  Patient was seen on 01/19/2020 for Pre-Operative Nutrition Assessment. Letter of approval faxed to [facility] bariatric surgery program coordinator on 01/19/2020  Referral stated Supervised Weight Loss (SWL) visits needed: 0  Planned surgery: sleeve gastrectomy  Pt expectation of surgery: to lose weight Pt expectation of dietitian: to help educate    NUTRITION ASSESSMENT   Anthropometrics  Start weight at [facility]: 294 lbs (date: 01/19/2020)  Height: 65 in BMI: 48.92 kg/m2     Clinical  Medical hx:  Medications:  Labs:  Notable signs/symptoms:   Lifestyle & Dietary Hx  Pt states she is active 30 minutes 4 days a week.  Pt answered her phone during the appointment.   24-Hr Dietary Recall First Meal: [place] bacon boiled egg and berries Snack: crackers Second Meal: meat quinoa and broccoli Snack: fruit Third Meal: chicken or fish with broccli or salad or spaghetti  Snack: popcorn or pretzels Beverages: water, sweet tea, premier protein, juice, coffee   Estimated Energy Needs Calories: 1500 Carbohydrate: 170g Protein: 112g Fat: 42g   NUTRITION DIAGNOSIS  Overweight/obesity ([place]-3.3) related to past poor dietary habits and physical inactivity as evidenced by patient w/ planned sleeve gastrectomy surgery following dietary guidelines for continued weight loss.    NUTRITION INTERVENTION  Nutrition counseling (C-1) and education (E-2) to facilitate bariatric surgery goals.   Pre-Op Goals Reviewed with the Patient . Track food and beverage intake (pen and paper, MyFitness Pal, Baritastic app, etc.) . Make healthy food choices while monitoring portion sizes . Consume 3 meals per day or try to eat every 3-5 hours . Avoid concentrated sugars and fried foods . Keep sugar & fat in the single digits per serving on food labels . Practice CHEWING your food (aim for applesauce  consistency) . Practice not drinking 15 minutes before, during, and 30 minutes after each meal and snack . Avoid all carbonated beverages (ex: soda, sparkling beverages)  . Limit caffeinated beverages (ex: coffee, tea, energy drinks) . Avoid all sugar-sweetened beverages (ex: regular soda, sports drinks)  . Avoid alcohol  . Aim for 64-100 ounces of FLUID daily (with at least half of fluid intake being plain water)  . Aim for at least 60-80 grams of PROTEIN daily . Look for a liquid protein source that contains ?15 g protein and ?5 g carbohydrate (ex: shakes, drinks, shots) . Make a list of non-food related activities . Physical activity is an important part of a healthy lifestyle so keep it moving! The goal is to reach 150 minutes of exercise per week, including cardiovascular and weight baring activity.  *Goals that are bolded indicate the pt would like to start working towards these  Handouts Provided Include  . Bariatric Surgery handouts (Nutrition Visits, Pre-Op Goals, Protein Shakes, Vitamins & Minerals)  Learning Style & Readiness for Change Teaching method utilized: Visual & Auditory  Demonstrated degree of understanding via: Teach Back  Barriers to learning/adherence to lifestyle change: none identified       MONITORING & EVALUATION Dietary intake, weekly physical activity, body weight, and pre-op goals reached at next nutrition visit.    Next Steps  Patient is to follow up at [facility] for Pre-Op Class >2 weeks before surgery for further nutrition education.

## 2020-01-31 ENCOUNTER — Other Ambulatory Visit (HOSPITAL_COMMUNITY): Payer: Self-pay | Admitting: General Surgery

## 2020-02-01 ENCOUNTER — Other Ambulatory Visit: Payer: Self-pay

## 2020-02-01 ENCOUNTER — Ambulatory Visit (HOSPITAL_COMMUNITY)
Admission: RE | Admit: 2020-02-01 | Discharge: 2020-02-01 | Disposition: A | Discharge status: Home / Self Care | Payer: BC Managed Care – PPO | Source: Ambulatory Visit | Attending: General Surgery | Admitting: General Surgery

## 2020-02-06 ENCOUNTER — Other Ambulatory Visit (HOSPITAL_COMMUNITY): Payer: BC Managed Care – PPO

## 2020-02-06 ENCOUNTER — Ambulatory Visit: Payer: BC Managed Care – PPO | Attending: Internal Medicine

## 2020-02-06 DIAGNOSIS — Z23 Encounter for immunization: Secondary | ICD-10-CM

## 2020-02-06 NOTE — Progress Notes (Signed)
   Covid-19 Vaccination Clinic  Name:  [name]    MRN: [id number] DOB: Apr 15, 1976  02/06/2020  [name] was observed post Covid-19 immunization for 15 minutes without incident. She was provided with Vaccine Information Sheet and instruction to access the V-Safe system.   [name] was instructed to call 911 with any severe reactions post vaccine: Marland Kitchen Difficulty breathing  . Swelling of face and throat  . A fast heartbeat  . A bad rash all over body  . Dizziness and weakness   Immunizations Administered    Name Date Dose VIS Date Route   Pfizer COVID-19 Vaccine 02/06/2020 10:09 AM 0.3 mL 10/30/2019 Intramuscular   Manufacturer: [place]   Lot: [id number]   [place]: [id number]

## 2020-05-09 ENCOUNTER — Encounter: Payer: BC Managed Care – PPO | Attending: General Surgery | Admitting: Skilled Nursing Facility1

## 2020-05-09 ENCOUNTER — Other Ambulatory Visit: Payer: Self-pay

## 2020-05-09 NOTE — Progress Notes (Signed)
Pre-Operative Nutrition Class:  Appt start time: 6269   End time:  1830.  Patient was seen on 05/09/2020 for Pre-Operative Bariatric Surgery Education [facility]    Anthropometrics  Start weight at [facility]: 294 lbs (date: 01/19/2020)  Weight: 306.4 BMI: 48.92 kg/m2    Samples given per MNT protocol. Patient educated on appropriate usage:  Bariatric Advantage Calcium  Lot # [id number] Exp: 11/21  Celebrate Vitamins multivitamin Lot # [id number] Exp:11/2020   Protein Shake Lot # [id number] Exp: aug 21  The following the learning objectives were met by the patient during this course:  Identify Pre-Op Dietary Goals and will begin 2 weeks pre-operatively  Identify appropriate sources of fluids and proteins   State protein recommendations and appropriate sources pre and post-operatively  Identify Post-Operative Dietary Goals and will follow for 2 weeks post-operatively  Identify appropriate multivitamin and calcium sources  Describe the need for physical activity post-operatively and will follow MD recommendations  State when to call healthcare provider regarding medication questions or post-operative complications  Handouts given during class include:  Pre-Op Bariatric Surgery Diet Handout  Protein Shake Handout  Post-Op Bariatric Surgery Nutrition Handout  [facility] Program Information Flyer  Support Group Information Flyer  [facility] Bariatric Supplements Price List  Follow-Up Plan: Patient will follow-up at [facility] 2 weeks post operatively for diet advancement per MD.

## 2020-06-01 NOTE — Patient Instructions (Addendum)
DUE TO COVID-19 ONLY ONE VISITOR IS ALLOWED TO COME WITH YOU AND STAY IN THE WAITING ROOM ONLY DURING PRE OP AND PROCEDURE DAY OF SURGERY. THE 1 VISITOR MAY VISIT WITH YOU AFTER SURGERY IN YOUR PRIVATE ROOM DURING VISITING HOURS ONLY!  YOU NEED TO HAVE A COVID 19 TEST ON: 06/02/20 @ 3:00 pm , THIS TEST MUST BE DONE BEFORE SURGERY, COME  [place] , [id number].  ([place]) ONCE YOUR COVID TEST IS COMPLETED, PLEASE BEGIN THE QUARANTINE INSTRUCTIONS AS OUTLINED IN YOUR HANDOUT.                JOVONNA NICKELL    Your procedure is scheduled on: 06/06/20   Report to [facility] Main  Entrance   Report to admitting at: 10:00 AM     Call this number if you have problems the morning of surgery [phone]    Remember: Do not eat solid food :After Midnight. Clear liquids from midnight until 9:00 am.    CLEAR LIQUID DIET   Foods Allowed                                                                     Foods Excluded  Coffee and tea, regular and decaf                             liquids that you cannot  Plain Jell-O any favor except red or purple                                           see through such as: Fruit ices (not with fruit pulp)                                     milk, soups, orange juice  Iced Popsicles                                    All solid food Carbonated beverages, regular and diet                                    Cranberry, grape and apple juices Sports drinks like Gatorade Lightly seasoned clear broth or consume(fat free) Sugar, honey syrup  Sample Menu Breakfast                                Lunch                                     Supper Cranberry juice                    Beef broth  Chicken broth Jell-O                                     Grape juice                           Apple juice Coffee or tea                        Jell-O                                      Popsicle                                                 Coffee or tea                        Coffee or tea  _____________________________________________________________________  BRUSH YOUR TEETH MORNING OF SURGERY AND RINSE YOUR MOUTH OUT, NO CHEWING GUM CANDY OR MINTS.     Take these medicines the morning of surgery with A SIP OF WATER: famotidine,omeprazole.                                 You may not have any metal on your body including hair pins and              piercings  Do not wear jewelry, make-up, lotions, powders or perfumes, deodorant             Do not wear nail polish on your fingernails.  Do not shave  48 hours prior to surgery.               Do not bring valuables to the hospital. [place].  Contacts, dentures or bridgework may not be worn into surgery.  Leave suitcase in the car. After surgery it may be brought to your room.     Patients discharged the day of surgery will not be allowed to drive home. IF YOU ARE HAVING SURGERY AND GOING HOME THE SAME DAY, YOU MUST HAVE AN ADULT TO DRIVE YOU HOME AND BE WITH YOU FOR 24 HOURS. YOU MAY GO HOME BY TAXI OR UBER OR ORTHERWISE, BUT AN ADULT MUST ACCOMPANY YOU HOME AND STAY WITH YOU FOR 24 HOURS.  Name and phone number of your driver:  Special Instructions: N/A              Please read over the following fact sheets you were given: _____________________________________________________________________         [facility] - Preparing for Surgery Before surgery, you can play an important role.  Because skin is not sterile, your skin needs to be as free of germs as possible.  You can reduce the number of germs on your skin by washing with CHG (chlorahexidine gluconate) soap before surgery.  CHG is an antiseptic cleaner which kills germs and bonds with the skin to continue killing germs even after washing.  Please DO NOT use if you have an allergy to CHG or antibacterial soaps.  If your skin becomes  reddened/irritated stop using the CHG and inform your nurse when you arrive at Short Stay. Do not shave (including legs and underarms) for at least 48 hours prior to the first CHG shower.  You may shave your face/neck. Please follow these instructions carefully:  1.  Shower with CHG Soap the night before surgery and the  morning of Surgery.  2.  If you choose to wash your hair, wash your hair first as usual with your  normal  shampoo.  3.  After you shampoo, rinse your hair and body thoroughly to remove the  shampoo.                           4.  Use CHG as you would any other liquid soap.  You can apply chg directly  to the skin and wash                       Gently with a scrungie or clean washcloth.  5.  Apply the CHG Soap to your body ONLY FROM THE NECK DOWN.   Do not use on face/ open                           Wound or open sores. Avoid contact with eyes, ears mouth and genitals (private parts).                       Wash face,  Genitals (private parts) with your normal soap.             6.  Wash thoroughly, paying special attention to the area where your surgery  will be performed.  7.  Thoroughly rinse your body with warm water from the neck down.  8.  DO NOT shower/wash with your normal soap after using and rinsing off  the CHG Soap.                9.  Pat yourself dry with a clean towel.            10.  Wear clean pajamas.            11.  Place clean sheets on your bed the night of your first shower and do not  sleep with pets. Day of Surgery : Do not apply any lotions/deodorants the morning of surgery.  Please wear clean clothes to the hospital/surgery center.  FAILURE TO FOLLOW THESE INSTRUCTIONS MAY RESULT IN THE CANCELLATION OF YOUR SURGERY PATIENT SIGNATURE_________________________________  NURSE SIGNATURE__________________________________  ________________________________________________________________________

## 2020-06-01 NOTE — Progress Notes (Signed)
Pt. Needs orders for the upcomming surgery.PST appointment on 06/02/20.Thanks.

## 2020-06-02 ENCOUNTER — Other Ambulatory Visit (HOSPITAL_COMMUNITY)
Admission: RE | Admit: 2020-06-02 | Discharge: 2020-06-02 | Disposition: A | Discharge status: Home / Self Care | Payer: BC Managed Care – PPO | Source: Ambulatory Visit | Attending: General Surgery | Admitting: General Surgery

## 2020-06-02 ENCOUNTER — Encounter (HOSPITAL_COMMUNITY): Payer: Self-pay

## 2020-06-02 ENCOUNTER — Other Ambulatory Visit: Payer: Self-pay

## 2020-06-02 ENCOUNTER — Encounter (HOSPITAL_COMMUNITY)
Admission: RE | Admit: 2020-06-02 | Discharge: 2020-06-02 | Disposition: A | Discharge status: Home / Self Care | Payer: BC Managed Care – PPO | Source: Ambulatory Visit | Attending: General Surgery | Admitting: General Surgery

## 2020-06-02 DIAGNOSIS — Z20822 Contact with and (suspected) exposure to covid-19: Secondary | ICD-10-CM | POA: Insufficient documentation

## 2020-06-02 DIAGNOSIS — Z01812 Encounter for preprocedural laboratory examination: Secondary | ICD-10-CM | POA: Insufficient documentation

## 2020-06-02 LAB — CBC
HCT: 41.9 % (ref 36.0–46.0)
Hemoglobin: 13.3 g/dL (ref 12.0–15.0)
MCH: 29.2 pg (ref 26.0–34.0)
MCHC: 31.7 g/dL (ref 30.0–36.0)
MCV: 91.9 fL (ref 80.0–100.0)
Platelets: 318 10*3/uL (ref 150–400)
RBC: 4.56 MIL/uL (ref 3.87–5.11)
RDW: 12.7 % (ref 11.5–15.5)
WBC: 5.6 10*3/uL (ref 4.0–10.5)
nRBC: 0 % (ref 0.0–0.2)

## 2020-06-02 LAB — SARS CORONAVIRUS 2 (TAT 6-24 HRS): SARS Coronavirus 2: NEGATIVE

## 2020-06-02 NOTE — Progress Notes (Signed)
COVID Vaccine Completed:yes  Date COVID Vaccine completed:02/06/20 COVID vaccine manufacturer: *[place]   PCP - Simona Huh NP Cardiologist -   Chest x-ray - 02/01/20. EPIC EKG - 02/01/20. EPIC Stress Test -  ECHO -  Cardiac Cath -   Sleep Study -  CPAP -   Fasting Blood Sugar -  Checks Blood Sugar _____ times a day  Blood Thinner Instructions: Aspirin Instructions: Last Dose:  Anesthesia review:   Patient denies shortness of breath, fever, cough and chest pain at PAT appointment   Patient verbalized understanding of instructions that were given to them at the PAT appointment. Patient was also instructed that they will need to review over the PAT instructions again at home before surgery.

## 2020-06-06 ENCOUNTER — Encounter (HOSPITAL_COMMUNITY): Payer: Self-pay | Admitting: General Surgery

## 2020-06-06 ENCOUNTER — Other Ambulatory Visit: Payer: Self-pay

## 2020-06-06 ENCOUNTER — Encounter (HOSPITAL_COMMUNITY)
Admission: RE | Disposition: A | Discharge status: Home / Self Care | Payer: Self-pay | Source: Home / Self Care | Attending: General Surgery

## 2020-06-06 ENCOUNTER — Inpatient Hospital Stay (HOSPITAL_COMMUNITY): Payer: BC Managed Care – PPO | Admitting: Certified Registered Nurse Anesthetist

## 2020-06-06 ENCOUNTER — Inpatient Hospital Stay (HOSPITAL_COMMUNITY)
Admission: RE | Admit: 2020-06-06 | Discharge: 2020-06-08 | DRG: 621 | Disposition: A | Discharge status: Home / Self Care | Payer: BC Managed Care – PPO | Attending: General Surgery | Admitting: General Surgery

## 2020-06-06 DIAGNOSIS — Z6841 Body Mass Index (BMI) 40.0 and over, adult: Secondary | ICD-10-CM

## 2020-06-06 DIAGNOSIS — Z791 Long term (current) use of non-steroidal anti-inflammatories (NSAID): Secondary | ICD-10-CM

## 2020-06-06 DIAGNOSIS — N898 Other specified noninflammatory disorders of vagina: Secondary | ICD-10-CM

## 2020-06-06 DIAGNOSIS — Z8249 Family history of ischemic heart disease and other diseases of the circulatory system: Secondary | ICD-10-CM

## 2020-06-06 DIAGNOSIS — M1990 Unspecified osteoarthritis, unspecified site: Secondary | ICD-10-CM | POA: Diagnosis present

## 2020-06-06 DIAGNOSIS — I10 Essential (primary) hypertension: Secondary | ICD-10-CM | POA: Diagnosis present

## 2020-06-06 DIAGNOSIS — Z833 Family history of diabetes mellitus: Secondary | ICD-10-CM | POA: Diagnosis not present

## 2020-06-06 DIAGNOSIS — Z807 Family history of other malignant neoplasms of lymphoid, hematopoietic and related tissues: Secondary | ICD-10-CM

## 2020-06-06 DIAGNOSIS — Z79899 Other long term (current) drug therapy: Secondary | ICD-10-CM

## 2020-06-06 DIAGNOSIS — Z83438 Family history of other disorder of lipoprotein metabolism and other lipidemia: Secondary | ICD-10-CM | POA: Diagnosis not present

## 2020-06-06 HISTORY — PX: LAPAROSCOPIC GASTRIC SLEEVE RESECTION: SHX5895

## 2020-06-06 HISTORY — PX: ESOPHAGOGASTRODUODENOSCOPY: SHX5428

## 2020-06-06 LAB — HEMOGLOBIN AND HEMATOCRIT, BLOOD
HCT: 41.8 % (ref 36.0–46.0)
Hemoglobin: 13.3 g/dL (ref 12.0–15.0)

## 2020-06-06 LAB — PREGNANCY, URINE: Preg Test, Ur: NEGATIVE

## 2020-06-06 SURGERY — GASTRECTOMY, SLEEVE, LAPAROSCOPIC
Anesthesia: General | Site: Esophagus

## 2020-06-06 MED ORDER — ACETAMINOPHEN 160 MG/5ML PO SOLN
1000.0000 mg | Freq: Three times a day (TID) | ORAL | Status: DC
  Filled 2020-06-06: qty 40.6

## 2020-06-06 MED ORDER — SUGAMMADEX SODIUM 200 MG/2ML IV SOLN
INTRAVENOUS | Status: DC | PRN
  Administered 2020-06-06: 500 mg via INTRAVENOUS

## 2020-06-06 MED ORDER — PROPOFOL 10 MG/ML IV BOLUS
INTRAVENOUS | Status: AC
  Filled 2020-06-06: qty 20

## 2020-06-06 MED ORDER — 0.9 % SODIUM CHLORIDE (POUR BTL) OPTIME
TOPICAL | Status: DC | PRN
  Administered 2020-06-06: 1000 mL

## 2020-06-06 MED ORDER — ACETAMINOPHEN 500 MG PO TABS
1000.0000 mg | ORAL_TABLET | Freq: Three times a day (TID) | ORAL | Status: DC
  Administered 2020-06-06 – 2020-06-07 (×4): 1000 mg via ORAL
  Filled 2020-06-06 (×5): qty 2

## 2020-06-06 MED ORDER — ONDANSETRON HCL 4 MG/2ML IJ SOLN
4.0000 mg | INTRAMUSCULAR | Status: DC | PRN
  Administered 2020-06-06 – 2020-06-07 (×4): 4 mg via INTRAVENOUS
  Filled 2020-06-06 (×4): qty 2

## 2020-06-06 MED ORDER — LIDOCAINE 2% (20 MG/ML) 5 ML SYRINGE
INTRAMUSCULAR | Status: AC
  Filled 2020-06-06: qty 5

## 2020-06-06 MED ORDER — HYDROMORPHONE HCL 1 MG/ML IJ SOLN
INTRAMUSCULAR | Status: AC
  Filled 2020-06-06: qty 1

## 2020-06-06 MED ORDER — STERILE WATER FOR IRRIGATION IR SOLN
Status: DC | PRN
  Administered 2020-06-06: 1000 mL

## 2020-06-06 MED ORDER — DEXAMETHASONE SODIUM PHOSPHATE 10 MG/ML IJ SOLN
INTRAMUSCULAR | Status: DC | PRN
  Administered 2020-06-06: 8 mg via INTRAVENOUS

## 2020-06-06 MED ORDER — SUGAMMADEX SODIUM 500 MG/5ML IV SOLN
INTRAVENOUS | Status: AC
  Filled 2020-06-06: qty 5

## 2020-06-06 MED ORDER — MORPHINE SULFATE (PF) 2 MG/ML IV SOLN: 1.0000 mg | INTRAVENOUS | Status: DC | PRN

## 2020-06-06 MED ORDER — SODIUM CHLORIDE 0.9 % IV SOLN
INTRAVENOUS | Status: AC
  Filled 2020-06-06: qty 2

## 2020-06-06 MED ORDER — ONDANSETRON HCL 4 MG/2ML IJ SOLN
INTRAMUSCULAR | Status: DC | PRN
  Administered 2020-06-06: 4 mg via INTRAVENOUS

## 2020-06-06 MED ORDER — ROCURONIUM BROMIDE 10 MG/ML (PF) SYRINGE
PREFILLED_SYRINGE | INTRAVENOUS | Status: AC
  Filled 2020-06-06: qty 10

## 2020-06-06 MED ORDER — ENOXAPARIN SODIUM 30 MG/0.3ML ~~LOC~~ SOLN
30.0000 mg | Freq: Two times a day (BID) | SUBCUTANEOUS | Status: DC
  Administered 2020-06-06 – 2020-06-08 (×4): 30 mg via SUBCUTANEOUS
  Filled 2020-06-06 (×4): qty 0.3

## 2020-06-06 MED ORDER — CHLORHEXIDINE GLUCONATE 0.12 % MT SOLN
15.0000 mL | Freq: Once | OROMUCOSAL | Status: AC
  Administered 2020-06-06: 15 mL via OROMUCOSAL

## 2020-06-06 MED ORDER — HYDROMORPHONE HCL 1 MG/ML IJ SOLN
0.2500 mg | INTRAMUSCULAR | Status: DC | PRN
  Administered 2020-06-06 (×3): 0.5 mg via INTRAVENOUS

## 2020-06-06 MED ORDER — LIDOCAINE 2% (20 MG/ML) 5 ML SYRINGE
INTRAMUSCULAR | Status: DC | PRN
  Administered 2020-06-06: 100 mg via INTRAVENOUS

## 2020-06-06 MED ORDER — ONDANSETRON HCL 4 MG/2ML IJ SOLN
4.0000 mg | Freq: Once | INTRAMUSCULAR | Status: AC | PRN
  Administered 2020-06-06: 4 mg via INTRAVENOUS

## 2020-06-06 MED ORDER — DEXAMETHASONE SODIUM PHOSPHATE 10 MG/ML IJ SOLN
INTRAMUSCULAR | Status: AC
  Filled 2020-06-06: qty 1

## 2020-06-06 MED ORDER — OXYCODONE HCL 5 MG/5ML PO SOLN
5.0000 mg | Freq: Four times a day (QID) | ORAL | Status: DC | PRN
  Administered 2020-06-07 – 2020-06-08 (×2): 5 mg via ORAL
  Filled 2020-06-06 (×2): qty 5

## 2020-06-06 MED ORDER — BUPIVACAINE HCL 0.25 % IJ SOLN
INTRAMUSCULAR | Status: DC | PRN
  Administered 2020-06-06: 30 mL

## 2020-06-06 MED ORDER — BUPIVACAINE LIPOSOME 1.3 % IJ SUSP
20.0000 mL | Freq: Once | INTRAMUSCULAR | Status: AC
  Administered 2020-06-06: 20 mL
  Filled 2020-06-06: qty 20

## 2020-06-06 MED ORDER — BUPIVACAINE HCL 0.25 % IJ SOLN
INTRAMUSCULAR | Status: AC
  Filled 2020-06-06: qty 1

## 2020-06-06 MED ORDER — ONDANSETRON HCL 4 MG/2ML IJ SOLN
INTRAMUSCULAR | Status: AC
  Filled 2020-06-06: qty 2

## 2020-06-06 MED ORDER — FENTANYL CITRATE (PF) 100 MCG/2ML IJ SOLN
INTRAMUSCULAR | Status: AC
  Filled 2020-06-06: qty 2

## 2020-06-06 MED ORDER — FAMOTIDINE IN NACL 20-0.9 MG/50ML-% IV SOLN
20.0000 mg | Freq: Two times a day (BID) | INTRAVENOUS | Status: DC
  Administered 2020-06-06 – 2020-06-07 (×4): 20 mg via INTRAVENOUS
  Filled 2020-06-06 (×4): qty 50

## 2020-06-06 MED ORDER — PHENYLEPHRINE 40 MCG/ML (10ML) SYRINGE FOR IV PUSH (FOR BLOOD PRESSURE SUPPORT)
PREFILLED_SYRINGE | INTRAVENOUS | Status: DC | PRN
  Administered 2020-06-06: 80 ug via INTRAVENOUS

## 2020-06-06 MED ORDER — DEXTROSE-NACL 5-0.45 % IV SOLN: INTRAVENOUS | Status: DC

## 2020-06-06 MED ORDER — PROPOFOL 10 MG/ML IV BOLUS
INTRAVENOUS | Status: DC | PRN
  Administered 2020-06-06: 50 mg via INTRAVENOUS
  Administered 2020-06-06: 110 mg via INTRAVENOUS

## 2020-06-06 MED ORDER — LIDOCAINE HCL (PF) 2 % IJ SOLN
INTRAMUSCULAR | Status: DC | PRN
Start: 2020-06-06 — End: 2020-06-06
  Administered 2020-06-06: 1.5 mg/kg/h via INTRADERMAL

## 2020-06-06 MED ORDER — MIDAZOLAM HCL 2 MG/2ML IJ SOLN
INTRAMUSCULAR | Status: AC
  Filled 2020-06-06: qty 2

## 2020-06-06 MED ORDER — ROCURONIUM BROMIDE 10 MG/ML (PF) SYRINGE
PREFILLED_SYRINGE | INTRAVENOUS | Status: DC | PRN
  Administered 2020-06-06: 100 mg via INTRAVENOUS

## 2020-06-06 MED ORDER — SIMETHICONE 80 MG PO CHEW: 80.0000 mg | CHEWABLE_TABLET | Freq: Four times a day (QID) | ORAL | Status: DC | PRN

## 2020-06-06 MED ORDER — LACTATED RINGERS IR SOLN
Status: DC | PRN
  Administered 2020-06-06: 1000 mL

## 2020-06-06 MED ORDER — PROMETHAZINE HCL 25 MG/ML IJ SOLN
12.5000 mg | Freq: Four times a day (QID) | INTRAMUSCULAR | Status: DC | PRN
  Administered 2020-06-06 – 2020-06-07 (×2): 12.5 mg via INTRAVENOUS
  Filled 2020-06-06 (×2): qty 1

## 2020-06-06 MED ORDER — MEPERIDINE HCL 50 MG/ML IJ SOLN: 6.2500 mg | INTRAMUSCULAR | Status: DC | PRN

## 2020-06-06 MED ORDER — ORAL CARE MOUTH RINSE: 15.0000 mL | Freq: Once | OROMUCOSAL | Status: AC

## 2020-06-06 MED ORDER — LACTATED RINGERS IV SOLN: INTRAVENOUS | Status: DC

## 2020-06-06 MED ORDER — FENTANYL CITRATE (PF) 250 MCG/5ML IJ SOLN
INTRAMUSCULAR | Status: DC | PRN
  Administered 2020-06-06: 150 ug via INTRAVENOUS
  Administered 2020-06-06 (×2): 25 ug via INTRAVENOUS

## 2020-06-06 MED ORDER — SODIUM CHLORIDE 0.9 % IV SOLN
2.0000 g | INTRAVENOUS | Status: AC
  Administered 2020-06-06: 2 g via INTRAVENOUS

## 2020-06-06 MED ORDER — ENSURE MAX PROTEIN PO LIQD
2.0000 [oz_av] | ORAL | Status: DC
  Administered 2020-06-07 – 2020-06-08 (×4): 2 [oz_av] via ORAL

## 2020-06-06 MED ORDER — MIDAZOLAM HCL 5 MG/5ML IJ SOLN
INTRAMUSCULAR | Status: DC | PRN
  Administered 2020-06-06: 2 mg via INTRAVENOUS

## 2020-06-06 SURGICAL SUPPLY — 54 items
APL PRP STRL LF DISP 70% ISPRP (MISCELLANEOUS) ×1
APPLIER CLIP ROT 13.4 12 LRG (CLIP)
BAG LAPAROSCOPIC 12 15 PORT 16 (BASKET) ×2 IMPLANT
BAG RETRIEVAL 12/15 (BASKET) ×3
BENZOIN TINCTURE PRP APPL 2/3 (GAUZE/BANDAGES/DRESSINGS) ×3 IMPLANT
BLADE SURG SZ11 CARB STEEL (BLADE) ×3 IMPLANT
BNDG ADH 1X3 SHEER STRL LF (GAUZE/BANDAGES/DRESSINGS) ×18 IMPLANT
CABLE HIGH FREQUENCY MONO STRZ (ELECTRODE) IMPLANT
CHLORAPREP W/TINT 26 (MISCELLANEOUS) ×3 IMPLANT
CLIP APPLIE ROT 13.4 12 LRG (CLIP) IMPLANT
COVER SURGICAL LIGHT HANDLE (MISCELLANEOUS) ×3 IMPLANT
COVER WAND RF STERILE (DRAPES) IMPLANT
DECANTER SPIKE VIAL GLASS SM (MISCELLANEOUS) ×3 IMPLANT
DRAPE UTILITY XL STRL (DRAPES) ×6 IMPLANT
ELECT REM PT RETURN 15FT ADLT (MISCELLANEOUS) ×3 IMPLANT
GAUZE 4X4 16PLY RFD (DISPOSABLE) ×3 IMPLANT
GLOVE BIOGEL PI IND STRL 7.0 (GLOVE) ×2 IMPLANT
GLOVE BIOGEL PI INDICATOR 7.0 (GLOVE) ×1
GLOVE SURG SS PI 7.0 STRL IVOR (GLOVE) ×3 IMPLANT
GOWN STRL REUS W/TWL LRG LVL3 (GOWN DISPOSABLE) ×3 IMPLANT
GOWN STRL REUS W/TWL XL LVL3 (GOWN DISPOSABLE) ×9 IMPLANT
GRASPER SUT TROCAR 14GX15 (MISCELLANEOUS) ×3 IMPLANT
HOVERMATT SINGLE USE (MISCELLANEOUS) IMPLANT
KIT BASIN OR (CUSTOM PROCEDURE TRAY) ×3 IMPLANT
KIT TURNOVER KIT A (KITS) IMPLANT
MARKER SKIN DUAL TIP RULER LAB (MISCELLANEOUS) ×3 IMPLANT
NEEDLE SPNL 22GX3.5 QUINCKE BK (NEEDLE) ×3 IMPLANT
PACK UNIVERSAL I (CUSTOM PROCEDURE TRAY) ×3 IMPLANT
RELOAD STAPLER BLUE 60MM (STAPLE) ×6 IMPLANT
RELOAD STAPLER GOLD 60MM (STAPLE) ×2 IMPLANT
RELOAD STAPLER GREEN 60MM (STAPLE) ×2 IMPLANT
SCISSORS LAP 5X45 EPIX DISP (ENDOMECHANICALS) IMPLANT
SET IRRIG TUBING LAPAROSCOPIC (IRRIGATION / IRRIGATOR) ×3 IMPLANT
SET TUBE SMOKE EVAC HIGH FLOW (TUBING) ×3 IMPLANT
SHEARS HARMONIC ACE PLUS 45CM (MISCELLANEOUS) ×3 IMPLANT
SLEEVE GASTRECTOMY 40FR VISIGI (MISCELLANEOUS) ×3 IMPLANT
SLEEVE XCEL OPT CAN 5 100 (ENDOMECHANICALS) ×6 IMPLANT
SOL ANTI FOG 6CC (MISCELLANEOUS) ×2 IMPLANT
SOLUTION ANTI FOG 6CC (MISCELLANEOUS) ×1
STAPLER ECHELON LONG 60 440 (INSTRUMENTS) ×3 IMPLANT
STAPLER RELOAD BLUE 60MM (STAPLE) ×9
STAPLER RELOAD GOLD 60MM (STAPLE) ×3
STAPLER RELOAD GREEN 60MM (STAPLE) ×3
STRIP CLOSURE SKIN 1/2X4 (GAUZE/BANDAGES/DRESSINGS) ×3 IMPLANT
SUT ETHIBOND 0 36 GRN (SUTURE) IMPLANT
SUT MNCRL AB 4-0 PS2 18 (SUTURE) ×3 IMPLANT
SUT VICRYL 0 TIES 12 18 (SUTURE) ×3 IMPLANT
SYR 20ML LL LF (SYRINGE) ×3 IMPLANT
SYR 50ML LL SCALE MARK (SYRINGE) ×3 IMPLANT
TOWEL OR 17X26 10 PK STRL BLUE (TOWEL DISPOSABLE) ×3 IMPLANT
TOWEL OR NON WOVEN STRL DISP B (DISPOSABLE) ×3 IMPLANT
TROCAR BLADELESS 15MM (ENDOMECHANICALS) ×3 IMPLANT
TROCAR BLADELESS OPT 5 100 (ENDOMECHANICALS) ×3 IMPLANT
TUBING CONNECTING 10 (TUBING) ×6 IMPLANT

## 2020-06-06 NOTE — Progress Notes (Signed)
Discussed post op day goals with patient including ambulation, IS, diet progression, pain, and nausea control.  BSTOP education provided including BSTOP information guide, "Guide for Pain Management after your Bariatric Procedure".  Questions answered. 

## 2020-06-06 NOTE — Anesthesia Procedure Notes (Signed)
Procedure Name: Intubation Performed by: Beauford Lando H, CRNA Pre-anesthesia Checklist: Patient identified, Emergency Drugs available, Suction available and Patient being monitored Patient Re-evaluated:Patient Re-evaluated prior to induction Oxygen Delivery Method: Circle System Utilized Preoxygenation: Pre-oxygenation with 100% oxygen Induction Type: IV induction Ventilation: Mask ventilation without difficulty Laryngoscope Size: Miller and 2 Grade View: Grade I Tube type: Oral Tube size: 7.0 mm Number of attempts: 1 Airway Equipment and Method: Stylet and Oral airway Placement Confirmation: ETT inserted through vocal cords under direct vision,  positive ETCO2 and breath sounds checked- equal and bilateral Secured at: 22 cm Tube secured with: Tape Dental Injury: Teeth and Oropharynx as per pre-operative assessment        

## 2020-06-06 NOTE — Progress Notes (Signed)
PHARMACY CONSULT FOR:  Risk Assessment for Post-Discharge VTE Following Bariatric Surgery  Post-Discharge VTE Risk Assessment: This patient's probability of 30-day post-discharge VTE is increased due to the factors marked:   Female    Age >/=60 years    BMI >/=50 kg/m2    CHF    Dyspnea at Rest    Paraplegia  x  Non-gastric-band surgery    Operation Time >/=3 hr    Return to OR     Length of Stay >/= 3 d   Hx of VTE   Hypercoagulable condition   Significant venous stasis       Predicted probability of 30-day post-discharge VTE: 0.16%     Recommendation for Discharge: No pharmacologic prophylaxis post-discharge       [name] is a 44 y.o. female who underwent  Laparoscopic sleeve gastrectomy  on 06/06/2020   Case start: 1215 Case end: 1305   Allergies  Allergen Reactions  . Amoxicillin Rash    Patient Measurements: Height: 5\' 6"  (167.6 cm) Weight: 131.5 kg (290 lb) IBW/kg (Calculated) : 59.3 Body mass index is 46.81 kg/m.  Recent Labs    06/06/20 1345  HGB 13.3  HCT 41.8   CrCl cannot be calculated (Patient's most recent lab result is older than the maximum 21 days allowed.).    Past Medical History:  Diagnosis Date  . Arthritis    knees, spine  . Bunion of left foot 01/2018     Medications Prior to Admission  Medication Sig Dispense Refill Last Dose  . Biotin 5000 MCG TABS Take 5,000 mcg by mouth daily.   Past Week at Unknown time  . famotidine (PEPCID) 20 MG tablet Take 20 mg by mouth daily.    06/06/2020 at [facility]  . meloxicam (MOBIC) 15 MG tablet Take 15 mg by mouth daily.   06/02/2020  . Multiple Vitamin (MULTIVITAMIN) tablet 1 tablet by Combination route daily.   Past Week at Unknown time  . omeprazole (PRILOSEC) 40 MG capsule Take 40 mg by mouth daily.    06/06/2020 at [facility]  . VITAMIN D PO Take 10,000 Units by mouth daily.    Past Week at Unknown time       Bertis Ruddy 06/06/2020,2:23 PM

## 2020-06-06 NOTE — H&P (Signed)
[name] is an 44 y.o. female.   Chief Complaint: obesity HPI: 44 yo female with long history of obesity. She presents for bariatric surgery.  Past Medical History:  Diagnosis Date  . Arthritis    knees, spine  . Bunion of left foot 01/2018    Past Surgical History:  Procedure Laterality Date  . BACK SURGERY     lumbar cyst excision  . BUNIONECTOMY Right   . BUNIONECTOMY WITH TARSAL MEDITARSAL FUSION Left 02/20/2018   Procedure: Left Lapidus; modified Alphonzo Grieve;  Surgeon: Wylene Simmer, MD;  Location: [facility];  Service: Orthopedics;  Laterality: Left;  . CESAREAN SECTION  04/24/2005  . CESAREAN SECTION  06/09/2012   Procedure: CESAREAN SECTION;  Surgeon: Sharene Butters, MD;  Location: [facility];  Service: Gynecology;  Laterality: N/A;  Repeat Cesarean Section Delivery Girl @ 1630, Apgars 9/9  . DILATION AND EVACUATION  02/28/2011  . EXPLORATORY LAPAROTOMY  04/25/2004   with myomectomy  . fibroidectomy    . WISDOM TOOTH EXTRACTION      Family History  Problem Relation Age of Onset  . Hypertension Father   . Hypercholesterolemia Father   . Diabetes Father   . Cancer Maternal Grandmother        pancreatic  . Cancer Maternal Grandfather        bone  . Cancer Paternal Uncle        Lymphoma  . Cancer Paternal Grandfather        Lung   Social History:  reports that she has never smoked. She has never used smokeless tobacco. She reports current alcohol use. She reports that she does not use drugs.  Allergies:  Allergies  Allergen Reactions  . Amoxicillin Rash    Medications Prior to Admission  Medication Sig Dispense Refill  . Biotin 5000 MCG TABS Take 5,000 mcg by mouth daily.    . famotidine (PEPCID) 20 MG tablet Take 20 mg by mouth daily.     . meloxicam (MOBIC) 15 MG tablet Take 15 mg by mouth daily.    . Multiple Vitamin (MULTIVITAMIN) tablet 1 tablet by Combination route daily.    Marland Kitchen omeprazole (PRILOSEC) 40 MG capsule Take 40 mg by  mouth daily.     Marland Kitchen VITAMIN D PO Take 10,000 Units by mouth daily.       Results for orders placed or performed during the hospital encounter of 06/06/20 (from the past 48 hour(s))  Pregnancy, urine per protocol     Status: None   Collection Time: 06/06/20  9:59 AM  Result Value Ref Range   Preg Test, Ur NEGATIVE NEGATIVE    Comment:        THE SENSITIVITY OF THIS METHODOLOGY IS >20 mIU/mL. Performed at [facility], [place] [address], [place], [place] [address]    No results found.  Review of Systems  Constitutional: Negative for chills and fever.  HENT: Negative for hearing loss.   Respiratory: Negative for cough.   Cardiovascular: Negative for chest pain and palpitations.  Gastrointestinal: Negative for abdominal pain, nausea and vomiting.  Genitourinary: Negative for dysuria and urgency.  Musculoskeletal: Negative for myalgias and neck pain.  Skin: Negative for rash.  Neurological: Negative for dizziness and headaches.  Hematological: Does not bruise/bleed easily.  Psychiatric/Behavioral: Negative for suicidal ideas.    Blood pressure (!) 155/120, pulse 85, temperature 98.2 F (36.8 C), temperature source Oral, resp. rate 16, height 5\' 6"  (1.676 m), weight 131.5 kg, last  menstrual period 05/02/2020, SpO2 99 %. Physical Exam Vitals reviewed.  Constitutional:      Appearance: She is well-developed.  HENT:     Head: Normocephalic and atraumatic.  Eyes:     Conjunctiva/sclera: Conjunctivae normal.     Pupils: Pupils are equal, round, and reactive to light.  Cardiovascular:     Rate and Rhythm: Normal rate and regular rhythm.  Pulmonary:     Effort: Pulmonary effort is normal.     Breath sounds: Normal breath sounds.  Abdominal:     General: Bowel sounds are normal. There is no distension.     Palpations: Abdomen is soft.     Tenderness: There is no abdominal tenderness.  Musculoskeletal:        General: Normal range of motion.     Cervical back:  Normal range of motion and neck supple.  Skin:    General: Skin is warm and dry.  Neurological:     Mental Status: She is alert and oriented to person, place, and time.  Psychiatric:        Behavior: Behavior normal.      Assessment/Plan 44 yo female with class III obesity -lap sleeve gastrectomy -ERAS protocol -bariatric protocol -inpatient procedure  Mickeal Skinner, MD 06/06/2020, 11:37 AM

## 2020-06-06 NOTE — Anesthesia Postprocedure Evaluation (Signed)
Anesthesia Post Note  Patient: [name]  Procedure(s) Performed: LAPAROSOCPIC SLEEVE GASTRECTOMY (N/A Abdomen) UPPER GASTROINTESTINAL ENDOSCOPY (N/A Esophagus)     Patient location during evaluation: PACU Anesthesia Type: General Level of consciousness: awake and alert Pain management: pain level controlled Vital Signs Assessment: post-procedure vital signs reviewed and stable Respiratory status: spontaneous breathing, nonlabored ventilation, respiratory function stable and patient connected to nasal cannula oxygen Cardiovascular status: blood pressure returned to baseline and stable Postop Assessment: no apparent nausea or vomiting Anesthetic complications: no   No complications documented.  Last Vitals:  Vitals:   06/06/20 1629 06/06/20 1732  BP: (!) 144/131 (!) 149/103  Pulse: 79 (!) 57  Resp: 16 16  Temp: 36.8 C 36.4 C  SpO2:  99%    Last Pain:  Vitals:   06/06/20 1437  TempSrc: Oral  PainSc:                  [name] DAVID

## 2020-06-06 NOTE — Transfer of Care (Signed)
Immediate Anesthesia Transfer of Care Note  Patient: [name]  Procedure(s) Performed: LAPAROSOCPIC SLEEVE GASTRECTOMY (N/A Abdomen) UPPER GASTROINTESTINAL ENDOSCOPY (N/A Esophagus)  Patient Location: PACU  Anesthesia Type:General  Level of Consciousness: drowsy  Airway & Oxygen Therapy: Patient Spontanous Breathing and Patient connected to face mask oxygen  Post-op Assessment: Report given to RN and Post -op Vital signs reviewed and stable  Post vital signs: Reviewed and stable  Last Vitals:  Vitals Value Taken Time  BP 101/82 06/06/20 1317  Temp    Pulse 60 06/06/20 1320  Resp 14 06/06/20 1320  SpO2 100 % 06/06/20 1320  Vitals shown include unvalidated device data.  Last Pain:  Vitals:   06/06/20 1015  TempSrc: Oral  PainSc:          Complications: No complications documented.

## 2020-06-06 NOTE — Op Note (Signed)
Preop Diagnosis: Obesity Class III  Postop Diagnosis: same  Procedure performed: laparoscopic Sleeve Gastrectomy  Assitant: Kaylyn Lim  Indications:  The patient is a 44 y.o. year-old morbidly obese female who has been followed in the Bariatric Clinic as an outpatient. This patient was diagnosed with morbid obesity with a BMI of Body mass index is 46.81 kg/m. and significant co-morbidities including hypertension.  The patient was counseled extensively in the Bariatric Outpatient Clinic and after a thorough explanation of the risks and benefits of surgery (including death from complications, bowel leak, infection such as peritonitis and/or sepsis, internal hernia, bleeding, need for blood transfusion, bowel obstruction, organ failure, pulmonary embolus, deep venous thrombosis, wound infection, incisional hernia, skin breakdown, and others entailed on the consent form) and after a compliant diet and exercise program, the patient was scheduled for an elective laparoscopic sleeve gastrectomy.  Description of Operation:  Following informed consent, the patient was taken to the operating room and placed on the operating table in the supine position.  She had previously received prophylactic antibiotics and subcutaneous heparin for DVT prophylaxis in the pre-op holding area.  After induction of general endotracheal anesthesia by the anesthesiologist, the patient underwent placement of sequential compression devices and an oro-gastric tube.  A timeout was confirmed by the surgery and anesthesia teams.  The patient was adequately padded at all pressure points and placed on a footboard to prevent slippage from the OR table during extremes of position during surgery.  She underwent a routine sterile prep and drape of her entire abdomen.    Next, A transverse incision was made under the left subcostal area and a 46mm optical viewing trocar was introduced into the peritoneal cavity. Pneumoperitoneum was applied  with a high flow and low pressure. A laparoscope was inserted to confirm placement. A extraperitoneal block was then placed at the lateral abdominal wall using exparel diluted with marcaine. 5 additional incisions were placed: 1 90mm trocar to the left of the midline. 1 additional 10mm trocar in the left lateral area, 1 109mm trocar in the right mid abdomen, 1 7mm trocar in the right subcostal area, and a Nathanson retractor was placed through a subxiphoid incision.  The fat pad at the GE junction was incised and the gastrodiaphragmatic ligament was divided using the Harmonic scalpel. Next, a hole was created through the lesser omentum along the greater curve of the stomach to enter the lesser sac. The vessels along the greater omentum were  Then ligated and divided using the Harmonic scalpel moving towards the spleen and then short gastric vessels were ligated and divided in the same fashion to fully mobilize the fundus. The left crus was identified to ensure completion of the dissection. Next the antrum was measured and dissection continued inferiorly along the greater curve towards the pylorus and stopped 6cm from the pylorus.   A 40Fr ViSiGi dilator was placed into the esophgaus and along the lesser curve of the stomach and placed on suction. 1 non-reinforced 59mm Green load echelon stapler(s) followed by 1 62mm Gold load echelon stapler(s) followed by 3 64mm blue load echelon stapler(s) were used to make the resection along the antrum being sure to stay well away from the angularis by angling the jaws of the stapler towards the greater curve and later completing the resection staying along the [place] and ensuring the fundus was not retained by appropriately retracting it lateral. Air was inserted through the [place] to perform a leak test showing no bubbles and a neutral lie  of the stomach.  The assistant then went and performed an upper endoscopy and leak test. No bubbles were seen and the sleeve and antrum  distended appropriately. The specimen was then placed in an endocatch bag and removed by the 71mm port. The fascia of the 53mm port was closed with a 0 vicryl by suture passer. Hemostasis was ensured. Pneumoperitoneum was evacuated, all ports were removed and all incisions closed with 4-0 monocryl suture in subcuticular fashion. Steristrips and bandaids were put in place for dressing. The patient awoke from anesthesia and was brought to pacu in stable condition. All counts were correct.  Estimated blood loss: <73ml  Specimens:  Sleeve gastrectomy  Local Anesthesia: 50 ml Exparel:0.5% Marcaine mix  Post-Op Plan:       Pain Management: PO, prn      Antibiotics: Prophylactic      Anticoagulation: Prophylactic, Starting now      Post Op Studies/Consults: Not applicable      Intended Discharge: within 48h      Intended Outpatient Follow-Up: Two Week      Intended Outpatient Studies: Not Applicable      Other: Not Applicable  Images:   Arta Bruce Trang Bouse

## 2020-06-06 NOTE — Discharge Instructions (Signed)
   GASTRIC BYPASS/SLEEVE  Home Care Instructions   These instructions are to help you care for yourself when you go home.  Call: If you have any problems. . Call [phone] and ask for the surgeon on call . If you need immediate help, come to the ER at [facility].  . Tell the ER staff that you are a new post-op gastric bypass or gastric sleeve patient   Signs and symptoms to report: . Severe vomiting or nausea o If you cannot keep down clear liquids for longer than 1 day, call your surgeon  . Abdominal pain that does not get better after taking your pain medication . Fever over 100.4 F with chills . Heart beating over 100 beats a minute . Shortness of breath at rest . Chest pain .  Redness, swelling, drainage, or foul odor at incision (surgical) sites .  If your incisions open or pull apart . Swelling or pain in calf (lower leg) . Diarrhea (Loose bowel movements that happen often), frequent watery, uncontrolled bowel movements . Constipation, (no bowel movements for 3 days) if this happens: Pick one o Milk of Magnesia, 2 tablespoons by mouth, 3 times a day for 2 days if needed o Stop taking Milk of Magnesia once you have a bowel movement o Call your doctor if constipation continues Or o Miralax  (instead of Milk of Magnesia) following the label instructions o Stop taking Miralax once you have a bowel movement o Call your doctor if constipation continues . Anything you think is not normal   Normal side effects after surgery: . Unable to sleep at night or unable to focus . Irritability or moody . Being tearful (crying) or depressed These are common complaints, possibly related to your anesthesia medications that put you to sleep, stress of surgery, and change in lifestyle.  This usually goes away a few weeks after surgery.  If these feelings continue, call your primary care doctor.   Wound Care: You may have surgical glue, steri-strips, or staples over your incisions after  surgery . Surgical glue:  Looks like a clear film over your incisions and will wear off a little at a time . Steri-strips: Strips of tape over your incisions. You may notice a yellowish color on the skin under the steri-strips. This is used to make the   steri-strips stick better. Do not pull the steri-strips off - let them fall off . Staples: Staples may be removed before you leave the hospital o If you go home with staples, call [facility], [phone] at for an appointment with your surgeon's nurse to have staples removed 10 days after surgery. . Showering: You may shower two (2) days after your surgery unless your surgeon tells you differently o Wash gently around incisions with warm soapy water, rinse well, and gently pat dry  o No tub baths until staples are removed, steri-strips fall off or glue is gone.    Medications: . Medications should be liquid or crushed if larger than the size of a dime . Extended release pills (medication that release a little bit at a time through the day) should NOT be crushed or cut. (examples include XL, ER, DR, SR) . Depending on the size and number of medications you take, you may need to space (take a few throughout the day)/change the time you take your medications so that you do not over-fill your pouch (smaller stomach) . Make sure you follow-up with your primary care doctor to   make medication changes needed during rapid weight loss and life-style changes . If you have diabetes, follow up with the doctor that orders your diabetes medication(s) within one week after surgery and check your blood sugar regularly. . Do not drive while taking prescription pain medication  . It is ok to take Tylenol by the bottle instructions with your pain medicine or instead of your pain medicine as needed.  DO NOT TAKE NSAIDS (EXAMPLES OF NSAIDS:  IBUPROFREN/ NAPROXEN)  Diet:                    First 2 Weeks  You will see the dietician t about two (2) weeks  after your surgery. The dietician will increase the types of foods you can eat if you are handling liquids well: . If you have severe vomiting or nausea and cannot keep down clear liquids lasting longer than 1 day, call your surgeon @ ([phone]) Protein Shake . Drink at least 2 ounces of shake 5-6 times per day . Each serving of protein shakes (usually 8 - 12 ounces) should have: o 15 grams of protein  o And no more than 5 grams of carbohydrate  . Goal for protein each day: o Men = 80 grams per day o Women = 60 grams per day . Protein powder may be added to fluids such as non-fat milk or Lactaid milk or unsweetened Soy/Almond milk (limit to 35 grams added protein powder per serving)  Hydration . Slowly increase the amount of water and other clear liquids as tolerated (See Acceptable Fluids) . Slowly increase the amount of protein shake as tolerated  .  Sip fluids slowly and throughout the day.  Do not use straws. . May use sugar substitutes in small amounts (no more than 6 - 8 packets per day; i.e. Splenda)  Fluid Goal . The first goal is to drink at least 8 ounces of protein shake/drink per day (or as directed by the nutritionist); some examples of protein shakes are Syntrax Nectar, Adkins Advantage, EAS Edge HP, and Unjury. See handout from pre-op Bariatric Education Class: o Slowly increase the amount of protein shake you drink as tolerated o You may find it easier to slowly sip shakes throughout the day o It is important to get your proteins in first . Your fluid goal is to drink 64 - 100 ounces of fluid daily o It may take a few weeks to build up to this . 32 oz (or more) should be clear liquids  And  . 32 oz (or more) should be full liquids (see below for examples) . Liquids should not contain sugar, caffeine, or carbonation  Clear Liquids: . Water or Sugar-free flavored water (i.e. Fruit H2O, Propel) . Decaffeinated coffee or tea (sugar-free) . Crystal Lite, Wyler's Lite,  Minute Maid Lite . Sugar-free Jell-O . Bouillon or broth . Sugar-free Popsicle:   *Less than 20 calories each; Limit 1 per day  Full Liquids: Protein Shakes/Drinks + 2 choices per day of other full liquids . Full liquids must be: o No More Than 15 grams of Carbs per serving  o No More Than 3 grams of Fat per serving . Strained low-fat cream soup (except Cream of Potato or Tomato) . Non-Fat milk . Fat-free Lactaid Milk . Unsweetened Soy Or Unsweetened Almond Milk . Low Sugar yogurt (Dannon Lite & Fit, Greek yogurt; Oikos Triple Zero; Chobani Simply 100; Yoplait 100 calorie Greek - No Fruit on the Bottom)    Vitamins   and Minerals . Start 1 day after surgery unless otherwise directed by your surgeon . Chewable Bariatric Specific Multivitamin / Multimineral Supplement with iron (Example: Bariatric Advantage Multi EA) . Chewable Calcium with Vitamin D-3 (Example: 3 Chewable Calcium Plus 600 with Vitamin D-3) o Take 500 mg three (3) times a day for a total of 1500 mg each day o Do not take all 3 doses of calcium at one time as it may cause constipation, and you can only absorb 500 mg  at a time  o Do not mix multivitamins containing iron with calcium supplements; take 2 hours apart . Menstruating women and those with a history of anemia (a blood disease that causes weakness) may need extra iron o Talk with your doctor to see if you need more iron . Do not stop taking or change any vitamins or minerals until you talk to your dietitian or surgeon . Your Dietitian and/or surgeon must approve all vitamin and mineral supplements   Activity and Exercise: Limit your physical activity as instructed by your doctor.  It is important to continue walking at home.  During this time, use these guidelines: . Do not lift anything greater than ten (10) pounds for at least two (2) weeks . Do not go back to work or drive until Engineer, production says you can . You may have sex when you feel comfortable  o It is  VERY important for female patients to use a reliable birth control method; fertility often increases after surgery  o All hormonal birth control will be ineffective for 30 days after surgery due to medications given during surgery a barrier method must be used. o Do not get pregnant for at least 18 months . Start exercising as soon as your doctor tells you that you can o Make sure your doctor approves any physical activity . Start with a simple walking program . Walk 5-15 minutes each day, 7 days per week.  . Slowly increase until you are walking 30-45 minutes per day Consider joining our [place] program. [phone] or email [email or web address]   Special Instructions Things to remember: . Use your CPAP when sleeping if this applies to you . [facility] has two free Bariatric Surgery Support Groups that meet monthly o The 3rd Thursday of each month, 6 pm o The 2nd Friday of each month . It is very important to keep all follow up appointments with your surgeon, dietitian, primary care physician, and behavioral health practitioner . Routine follow up schedule with your surgeon include appointments at 2-3 weeks, 6-8 weeks, 6 months, and 1 year at a minimum.  Your surgeon may request to see you more often.   . After the first year, please follow up with your bariatric surgeon and dietitian at least once a year in order to maintain best weight loss results   [facility]: [place]: [phone] Bariatric Nurse Coordinator: [phone]      Reviewed and Endorsed  by [facility] Patient Education Committee, June, 2016 Edits Approved: Aug, 2018

## 2020-06-06 NOTE — Op Note (Signed)
[name] [id number] 1976/04/03 06/06/2020  Preoperative diagnosis: sleeve gastrectomy in progress  Postoperative diagnosis: Same   Procedure: Upper endoscopy   Surgeon: Catalina Antigua B. Hassell Done  M.D., FACS   Anesthesia: Gen.   Indications for procedure: This patient was undergoing a sleeve gastrectomy.  Endoscopy to examine geometry of sleeve and to look for bleeding or leaks.    Description of procedure: The endoscopy was placed in the mouth and into the oropharynx and under endoscopic vision it was advanced to the esophagogastric junction.  The pouch was insufflated and the sleeve as cylindrical and the scope passed to the antrum without difficulty or kinks. The pylorus and duodenum were visualized.   No bleeding or leaks were detected.  The scope was withdrawn without difficulty.     Matt B. Hassell Done, MD, FACS General, Bariatric, & Minimally Invasive Surgery [facility], [place]

## 2020-06-06 NOTE — Progress Notes (Signed)
Even though pt was nauseated, she wanted to try her water.

## 2020-06-06 NOTE — Anesthesia Preprocedure Evaluation (Signed)
Anesthesia Evaluation  Patient identified by MRN, date of birth, ID band Patient awake    Reviewed: Allergy & Precautions, NPO status , Patient's Chart, lab work & pertinent test results  Airway Mallampati: I  TM Distance: >3 FB Neck ROM: Full    Dental   Pulmonary    Pulmonary exam normal        Cardiovascular Normal cardiovascular exam     Neuro/Psych    GI/Hepatic   Endo/Other    Renal/GU      Musculoskeletal   Abdominal   Peds  Hematology   Anesthesia Other Findings   Reproductive/Obstetrics                             Anesthesia Physical Anesthesia Plan  ASA: III  Anesthesia Plan: General   Post-op Pain Management:    Induction: Intravenous  PONV Risk Score and Plan: 3 and Ondansetron, Midazolam and Treatment may vary due to age or medical condition  Airway Management Planned: Oral ETT  Additional Equipment:   Intra-op Plan:   Post-operative Plan: Extubation in OR  Informed Consent: I have reviewed the patients History and Physical, chart, labs and discussed the procedure including the risks, benefits and alternatives for the proposed anesthesia with the patient or authorized representative who has indicated his/her understanding and acceptance.       Plan Discussed with: CRNA and Surgeon  Anesthesia Plan Comments:         Anesthesia Quick Evaluation  

## 2020-06-07 ENCOUNTER — Encounter (HOSPITAL_COMMUNITY): Payer: Self-pay | Admitting: General Surgery

## 2020-06-07 LAB — COMPREHENSIVE METABOLIC PANEL
ALT: 24 U/L (ref 0–44)
AST: 25 U/L (ref 15–41)
Albumin: 3.9 g/dL (ref 3.5–5.0)
Alkaline Phosphatase: 76 U/L (ref 38–126)
Anion gap: 13 (ref 5–15)
BUN: 8 mg/dL (ref 6–20)
CO2: 24 mmol/L (ref 22–32)
Calcium: 9.4 mg/dL (ref 8.9–10.3)
Chloride: 100 mmol/L (ref 98–111)
Creatinine, Ser: 0.8 mg/dL (ref 0.44–1.00)
GFR calc Af Amer: >60 mL/min (ref >60)
GFR calc non Af Amer: >60 mL/min (ref >60)
Glucose, Bld: 118 mg/dL — ABNORMAL HIGH (ref 70–99)
Potassium: 3.6 mmol/L (ref 3.5–5.1)
Sodium: 137 mmol/L (ref 135–145)
Total Bilirubin: 0.6 mg/dL (ref 0.3–1.2)
Total Protein: 7.9 g/dL (ref 6.5–8.1)

## 2020-06-07 LAB — SURGICAL PATHOLOGY

## 2020-06-07 LAB — CBC WITH DIFFERENTIAL/PLATELET
Abs Immature Granulocytes: 0.06 10*3/uL (ref 0.00–0.07)
Basophils Absolute: 0 10*3/uL (ref 0.0–0.1)
Basophils Relative: 0 %
Eosinophils Absolute: 0 10*3/uL (ref 0.0–0.5)
Eosinophils Relative: 0 %
HCT: 39.4 % (ref 36.0–46.0)
Hemoglobin: 12.6 g/dL (ref 12.0–15.0)
Immature Granulocytes: 1 %
Lymphocytes Relative: 9 %
Lymphs Abs: 1.2 10*3/uL (ref 0.7–4.0)
MCH: 29.6 pg (ref 26.0–34.0)
MCHC: 32 g/dL (ref 30.0–36.0)
MCV: 92.5 fL (ref 80.0–100.0)
Monocytes Absolute: 1.1 10*3/uL — ABNORMAL HIGH (ref 0.1–1.0)
Monocytes Relative: 8 %
Neutro Abs: 10.6 10*3/uL — ABNORMAL HIGH (ref 1.7–7.7)
Neutrophils Relative %: 82 %
Platelets: 287 10*3/uL (ref 150–400)
RBC: 4.26 MIL/uL (ref 3.87–5.11)
RDW: 12.7 % (ref 11.5–15.5)
WBC: 13 10*3/uL — ABNORMAL HIGH (ref 4.0–10.5)
nRBC: 0 % (ref 0.0–0.2)

## 2020-06-07 NOTE — Progress Notes (Signed)
Patient alert and oriented, Post op day 1.  Provided support and encouragement.  Encouraged pulmonary toilet, ambulation and small sips of liquids.  Completed 12 ounces of bari clear liquid. All questions answered.  Will continue to monitor.

## 2020-06-07 NOTE — Progress Notes (Signed)
Starting protein after completing bari clear fluid requirement.

## 2020-06-08 LAB — CBC WITH DIFFERENTIAL/PLATELET
Abs Immature Granulocytes: 0.02 10*3/uL (ref 0.00–0.07)
Basophils Absolute: 0 10*3/uL (ref 0.0–0.1)
Basophils Relative: 0 %
Eosinophils Absolute: 0 10*3/uL (ref 0.0–0.5)
Eosinophils Relative: 0 %
HCT: 39.5 % (ref 36.0–46.0)
Hemoglobin: 12.7 g/dL (ref 12.0–15.0)
Immature Granulocytes: 0 %
Lymphocytes Relative: 20 %
Lymphs Abs: 1.7 10*3/uL (ref 0.7–4.0)
MCH: 29.9 pg (ref 26.0–34.0)
MCHC: 32.2 g/dL (ref 30.0–36.0)
MCV: 92.9 fL (ref 80.0–100.0)
Monocytes Absolute: 1 10*3/uL (ref 0.1–1.0)
Monocytes Relative: 12 %
Neutro Abs: 5.9 10*3/uL (ref 1.7–7.7)
Neutrophils Relative %: 68 %
Platelets: 274 10*3/uL (ref 150–400)
RBC: 4.25 MIL/uL (ref 3.87–5.11)
RDW: 12.9 % (ref 11.5–15.5)
WBC: 8.6 10*3/uL (ref 4.0–10.5)
nRBC: 0 % (ref 0.0–0.2)

## 2020-06-08 MED ORDER — ACETAMINOPHEN 500 MG PO TABS: 1000.0000 mg | ORAL_TABLET | Freq: Three times a day (TID) | ORAL | 0 refills | Status: AC

## 2020-06-08 MED ORDER — PROMETHAZINE HCL 12.5 MG PO TABS
12.5000 mg | ORAL_TABLET | Freq: Four times a day (QID) | ORAL | 0 refills | Status: DC | PRN
Start: 2020-06-08 — End: 2020-12-26

## 2020-06-08 MED ORDER — GABAPENTIN 100 MG PO CAPS
200.0000 mg | ORAL_CAPSULE | Freq: Two times a day (BID) | ORAL | 0 refills | Status: DC
Start: 2020-06-08 — End: 2022-03-06

## 2020-06-08 NOTE — Progress Notes (Signed)

## 2020-06-08 NOTE — Discharge Summary (Signed)
Physician Discharge Summary  [name] VQM:[id number] DOB: 10/15/1976 DOA: 06/06/2020  PCP: Simona Huh, NP  Admit date: 06/06/2020 Discharge date: 06/08/2020  Recommendations for Outpatient Follow-up:  1.  (include homehealth, outpatient follow-up instructions, specific recommendations for PCP to follow-up on, etc.)   Follow-up Information    [facility] Spina, Arta Bruce, MD. Go on 06/22/2020.   Specialty: General Surgery Why: at 230 pm.  Please arrive 15 minutes before appointment time.  Thank you Contact information: [place] [address] [phone]        Surgery, [place]. Go on 07/29/2020.   Specialty: General Surgery Why: at 140pm with Dr. Gurney Maxin.  Please arrive 15 minutes prior to appointment time.  Thank you Contact information: [place]  [address] [phone]              Discharge Diagnoses:  Active Problems:   Morbid obesity ([place])   Surgical Procedure: laparoscopic sleeve gastrectomy, upper endoscopy  Discharge Condition: Good Disposition: Home  Diet recommendation: Postoperative sleeve gastrectomy diet (liquids only)  Filed Weights   06/06/20 1001  Weight: 131.5 kg     Hospital Course:  The patient was admitted after undergoing laparoscopic sleeve gastrectomy. POD 0 she ambulated well. POD 1 she was started on the water diet protocol and tolerated 200 ml in the first shift. She had high nausea the first day that improved in the night. Once meeting the water amount she was advanced to bariatric protein shakes which they tolerated and were discharged home POD 2.  Treatments: surgery: laparoscopic sleeve gastrectomy  Discharge Instructions  Discharge Instructions    Ambulate hourly while awake   Complete by: As directed    Call MD for:  difficulty breathing, headache or visual disturbances   Complete by: As directed    Call MD for:  persistant dizziness or light-headedness    Complete by: As directed    Call MD for:  persistant nausea and vomiting   Complete by: As directed    Call MD for:  redness, tenderness, or signs of infection (pain, swelling, redness, odor or green/yellow discharge around incision site)   Complete by: As directed    Call MD for:  severe uncontrolled pain   Complete by: As directed    Call MD for:  temperature >101 F   Complete by: As directed    Diet bariatric full liquid   Complete by: As directed    Discharge wound care:   Complete by: As directed    Remove Bandaids tomorrow, ok to shower tomorrow. Steristrips may fall off in 1-3 weeks.   Incentive spirometry   Complete by: As directed    Perform hourly while awake     Allergies as of 06/08/2020      Reactions   Amoxicillin Rash      Medication List    STOP taking these medications   famotidine 20 MG tablet Commonly known as: PEPCID   meloxicam 15 MG tablet Commonly known as: MOBIC     TAKE these medications   acetaminophen 500 MG tablet Commonly known as: TYLENOL Take 2 tablets (1,000 mg total) by mouth every 8 (eight) hours for 5 days.   Biotin 5000 MCG Tabs Take 5,000 mcg by mouth daily.   gabapentin 100 MG capsule Commonly known as: NEURONTIN Take 2 capsules (200 mg total) by mouth every 12 (twelve) hours.   multivitamin tablet 1 tablet by Combination route daily.   omeprazole  40 MG capsule Commonly known as: PRILOSEC Take 40 mg by mouth daily.   promethazine 12.5 MG tablet Commonly known as: PHENERGAN Take 1 tablet (12.5 mg total) by mouth every 6 (six) hours as needed for nausea or vomiting.   VITAMIN D PO Take 10,000 Units by mouth daily.            Discharge Care Instructions  (From admission, onward)         Start     Ordered   06/08/20 0000  Discharge wound care:       Comments: Remove Bandaids tomorrow, ok to shower tomorrow. Steristrips may fall off in 1-3 weeks.   06/08/20 8341          Follow-up Information    Kimi Kroft,  Arta Bruce, MD. Go on 06/22/2020.   Specialty: General Surgery Why: at 230 pm.  Please arrive 15 minutes before appointment time.  Thank you Contact information: [place] [address] [phone]        Surgery, [place]. Go on 07/29/2020.   Specialty: General Surgery Why: at 140pm with Dr. Gurney Maxin.  Please arrive 15 minutes prior to appointment time.  Thank you Contact information: [place] [address] [phone]                The results of significant diagnostics from this hospitalization (including imaging, microbiology, ancillary and laboratory) are listed below for reference.    Significant Diagnostic Studies: No results found.  Labs: Basic Metabolic Panel: Recent Labs  Lab 06/07/20 0747  NA 137  K 3.6  CL 100  CO2 24  GLUCOSE 118*  BUN 8  CREATININE 0.80  CALCIUM 9.4   Liver Function Tests: Recent Labs  Lab 06/07/20 0747  AST 25  ALT 24  ALKPHOS 76  BILITOT 0.6  PROT 7.9  ALBUMIN 3.9    CBC: Recent Labs  Lab 06/02/20 0845 06/06/20 1345 06/07/20 0747 06/08/20 0504  WBC 5.6  --  13.0* 8.6  NEUTROABS  --   --  10.6* 5.9  HGB 13.3 13.3 12.6 12.7  HCT 41.9 41.8 39.4 39.5  MCV 91.9  --  92.5 92.9  PLT 318  --  287 274    CBG: No results for input(s): GLUCAP in the last 168 hours.  Active Problems:   Morbid obesity ([place])   VTE plan: no chemical prophylaxis recommended ([email or web address])  Time coordinating discharge: 15 min

## 2020-06-13 ENCOUNTER — Telehealth (HOSPITAL_COMMUNITY): Payer: Self-pay

## 2020-06-13 NOTE — Telephone Encounter (Signed)
Patient called to discuss post bariatric surgery follow up questions.  See below:   1.  Tell me about your pain and pain management? Not significant  2.  Let's talk about fluid intake.  How much total fluid are you taking in?50-55 ounces   3.  How much protein have you taken in the last 2 days?60 grams  4.  Have you had nausea?  Tell me about when have experienced nausea and what you did to help? denies  5.  Has the frequency or color changed with your urine? Urine light in color urinating regularly  6.  Tell me what your incisions look like?no problems  7.  Have you been passing gas? BM? Had BM took miralax  8.  If a problem or question were to arise who would you call?  Do you know contact numbers for [place], [facility], and [facility]?aware of how to contact  9.  How has the walking going?walking regularly  10.  How are your vitamins and calcium going?  How are you taking them?MVI and calcium started

## 2020-06-21 ENCOUNTER — Encounter: Payer: BC Managed Care – PPO | Attending: General Surgery | Admitting: Skilled Nursing Facility1

## 2020-06-21 ENCOUNTER — Other Ambulatory Visit: Payer: Self-pay

## 2020-06-21 NOTE — Progress Notes (Signed)
2 Week Post-Operative Nutrition Class   Patient was seen on 01/13/19 for Post-Operative Nutrition education at the [facility].    Surgery date: 06/06/2020 Surgery type: sleeve Start weight at [facility]: 294 Weight today: 275.2   Body Composition Scale 06/21/2020  Total Body Fat % 46.2  Visceral Fat 16  Fat-Free Mass % 53.7   Total Body Water % 41.3   Muscle-Mass lbs 33.7  Body Fat Displacement          Torso  lbs 79         Left Leg  lbs 15.8         Right Leg  lbs 15.8         Left Arm  lbs 7.9         Right Arm   lbs 7.9     The following the learning objectives were met by the patient during this course:  Identifies Phase 3 (Soft, High Proteins) Dietary Goals and will begin from 2 weeks post-operatively to 2 months post-operatively  Identifies appropriate sources of fluids and proteins   States protein recommendations and appropriate sources post-operatively  Identifies the need for appropriate texture modifications, mastication, and bite sizes when consuming solids  Identifies appropriate multivitamin and calcium sources post-operatively  Describes the need for physical activity post-operatively and will follow MD recommendations  States when to call healthcare provider regarding medication questions or post-operative complications   Handouts given during class include:  Phase 3A: Soft, High Protein Diet Handout   Follow-Up Plan: Patient will follow-up at [facility] in 6 weeks for 2 month post-op nutrition visit for diet advancement per MD.

## 2020-06-27 ENCOUNTER — Telehealth: Payer: Self-pay | Admitting: Skilled Nursing Facility1

## 2020-06-27 NOTE — Telephone Encounter (Signed)
RD called pt to verify fluid intake once starting soft, solid proteins 2 week post-bariatric surgery.   Daily Fluid intake: Daily Protein intake:  Concerns/issues:   LVM 

## 2020-08-04 ENCOUNTER — Encounter: Payer: BC Managed Care – PPO | Attending: General Surgery | Admitting: Skilled Nursing Facility1

## 2020-08-04 ENCOUNTER — Other Ambulatory Visit: Payer: Self-pay

## 2020-08-04 NOTE — Progress Notes (Signed)
Bariatric Nutrition Follow-Up Visit Medical Nutrition Therapy   2 Months Post-Operative sleeve Surgery Surgery Date: 06/06/2020  Pt given star previously:   NUTRITION ASSESSMENT    Anthropometrics  Start weight at [facility]: 294 lbs (date: 01/19/2020) Today's weight: 260.2 lbs  Body Composition Scale 08/04/2020  Weight  lbs 260.2  Total Body Fat  % 44.9     Visceral Fat 15  Fat-Free Mass  % 55     Total Body Water  % 42     Muscle-Mass  lbs 33.5  BMI 41.9  Body Fat Displacement ---        Torso  lbs 72.5        Left Leg  lbs 14.5        Right Leg  lbs 14.5        Left Arm  lbs 7.2        Right Arm  lbs 7.2   Clinical  Medical hx:  Medications:  Labs:    Lifestyle & Dietary Hx  Pt has no complaints.  Estimated daily fluid intake: 60+ oz Estimated daily protein intake: 80 g Supplements: opurity and calcium  Current average weekly physical activity: walking 2-3 miles 3 times a week; some resistance   24-Hr Dietary Recall First Meal: frozen breakfast bowl or protein shake or yogurt Snack:  Second Meal: tuna or meat Snack: deli meat Third Meal: 2 chicken wings Snack: protein yogurt (25 grams pro) Beverages: water, water + flavorings, gatorade zero, sugar free twist  Post-Op Goals/ Signs/ Symptoms Using straws: no Drinking while eating: no Chewing/swallowing difficulties: no Changes in vision: no Changes to mood/headaches: no Hair loss/changes to skin/nails: no Difficulty focusing/concentrating: no Sweating: no Dizziness/lightheadedness: no Palpitations: no Carbonated/caffeinated beverages: no N/V/D/C/Gas: no Abdominal pain: no Dumping syndrome: no    NUTRITION DIAGNOSIS  Overweight/obesity ([place]-3.3) related to past poor dietary habits and physical inactivity as evidenced by completed bariatric surgery and following dietary guidelines for continued weight loss and healthy nutrition status.     NUTRITION INTERVENTION Nutrition counseling (C-1) and  education (E-2) to facilitate bariatric surgery goals, including: . Diet advancement to the next phase (phase 4) now including non starchy vegetables  . The importance of consuming adequate calories as well as certain nutrients daily due to the body's need for essential vitamins, minerals, and fats . The importance of daily physical activity and to reach a goal of at least 150 minutes of moderate to vigorous physical activity weekly (or as directed by their physician) due to benefits such as increased musculature and improved lab values . The importance of intuitive eating specifically learning hunger-satiety cues and understanding the importance of learning a new body  Handouts Provided Include   Phase 4  Learning Style & Readiness for Change Teaching method utilized: Visual & Auditory  Demonstrated degree of understanding via: Teach Back  Barriers to learning/adherence to lifestyle change: none identified   RD's Notes for Next Visit . Assess adherence to pt chosen goals   MONITORING & EVALUATION Dietary intake, weekly physical activity, body weight  Next Steps Patient is to follow-up in 3 months

## 2020-11-01 ENCOUNTER — Encounter: Payer: BC Managed Care – PPO | Attending: General Surgery | Admitting: Skilled Nursing Facility1

## 2020-11-10 ENCOUNTER — Other Ambulatory Visit: Payer: Self-pay

## 2020-11-10 ENCOUNTER — Encounter: Payer: BC Managed Care – PPO | Admitting: Skilled Nursing Facility1

## 2020-11-10 NOTE — Progress Notes (Signed)
Bariatric Nutrition Follow-Up Visit Medical Nutrition Therapy   2 Months Post-Operative sleeve Surgery Surgery Date: 06/06/2020  Pt given star previously: no  NUTRITION ASSESSMENT    Anthropometrics  Start weight at [facility]: 294 lbs (date: 01/19/2020) Today's weight: 243.1 lbs  Body Composition Scale 08/04/2020 11/10/2020  Weight  lbs 260.2 243.1  Total Body Fat  % 44.9 43.1     Visceral Fat 15 13  Fat-Free Mass  % 55 56.8     Total Body Water  % 42 42.9     Muscle-Mass  lbs 33.5 33.5  BMI 41.9 39.1  Body Fat Displacement ---         Torso  lbs 72.5 65        Left Leg  lbs 14.5 13        Right Leg  lbs 14.5 13        Left Arm  lbs 7.2 6.5        Right Arm  lbs 7.2 6.5   Clinical  Medical hx:  Medications:  Labs:    Lifestyle & Dietary Hx  Pt states she had 2 occasions of food coming back up about 2 hours after eating stating maybe the bites were too large. Pt states she cooks her vegetables with crunch still in them. Pt states she has added in fruit recognizes this was not technically added yet. Pt states works gets in the way of proper hydration.   Estimated daily fluid intake: 60+ oz Estimated daily protein intake: 80 g Supplements: opurity and calcium  Current average weekly physical activity: walking 2-3 miles 3 times a week; some resistance   24-Hr Dietary Recall: vegetables cooked with smoked [place] First Meal: protein shake + decaff coffee  Snack: cheese or almonds  Second Meal: tuna or meat + broccoli or carrots or green beans or processed meats and cheeses Snack: deli meat or half protein shake or nuts Third Meal: salmon or chicken or pork chop + broccoli or carrots  Snack: protein yogurt (25 grams pro) or fruit Beverages: water, water + flavorings, gatorade zero, sugar free twist  Post-Op Goals/ Signs/ Symptoms Using straws: no Drinking while eating: no Chewing/swallowing difficulties: no Changes in vision: no Changes to mood/headaches: no Hair  loss/changes to skin/nails: no Difficulty focusing/concentrating: no Sweating: no Dizziness/lightheadedness: no Palpitations: no Carbonated/caffeinated beverages: no N/V/D/C/Gas: no Abdominal pain: no Dumping syndrome: no    NUTRITION DIAGNOSIS  Overweight/obesity (-3.3) related to past poor dietary habits and physical inactivity as evidenced by completed bariatric surgery and following dietary guidelines for continued weight loss and healthy nutrition status.     NUTRITION INTERVENTION Nutrition counseling (C-1) and education (E-2) to facilitate bariatric surgery goals, including:  The importance of consuming adequate calories as well as certain nutrients daily due to the body's need for essential vitamins, minerals, and fats  The importance of daily physical activity and to reach a goal of at least 150 minutes of moderate to vigorous physical activity weekly (or as directed by their physician) due to benefits such as increased musculature and improved lab values  The importance of intuitive eating specifically learning hunger-satiety cues and understanding the importance of learning a new body  Added in fruit rather than starchy vegetables due to helping with daily bowel movements   Goals: Avoid a Protein shake for breakfast daily Aim for a minimum of 64 fluid ounces per day: look for holes in your day to drink  Handouts Provided Include     Learning Style &  Readiness for Change Teaching method utilized: Visual & Auditory  Demonstrated degree of understanding via: Teach Back  Barriers to learning/adherence to lifestyle change: none identified   RD's Notes for Next Visit  Assess adherence to pt chosen goals   MONITORING & EVALUATION Dietary intake, weekly physical activity, body weight  Next Steps Patient is to follow-up in 3 months

## 2020-12-26 ENCOUNTER — Other Ambulatory Visit: Payer: Self-pay

## 2020-12-26 ENCOUNTER — Ambulatory Visit: Payer: BC Managed Care – PPO | Admitting: Nurse Practitioner

## 2020-12-26 ENCOUNTER — Encounter: Payer: Self-pay | Admitting: Nurse Practitioner

## 2020-12-26 VITALS — BP 118/74 | Ht 65.75 in | Wt 247.0 lb

## 2020-12-26 DIAGNOSIS — Z01419 Encounter for gynecological examination (general) (routine) without abnormal findings: Secondary | ICD-10-CM | POA: Diagnosis not present

## 2020-12-26 NOTE — Progress Notes (Signed)
   [name] 08/14/1976 [id number]   History:  45 y.o. [id number] presents for annual exam without GYN complaints. Normal pap history. Has not had screening mammogram. Gastric sleeve July 2021, down about 50 pounds.   Gynecologic History Patient's last menstrual period was 12/01/2020 (approximate).   Contraception/Family planning: vasectomy  Health Maintenance Last Pap: 12/23/2019. Results were: normal Last mammogram: Never  Past medical history, past surgical history, family history and social history were all reviewed and documented in the EPIC chart.  ROS:  A ROS was performed and pertinent positives and negatives are included.  Exam:  Vitals:   12/26/20 1621  BP: 118/74  Weight: 247 lb (112 kg)  Height: 5' 5.75" (1.67 m)   Body mass index is 40.17 kg/m.  General appearance:  Normal Thyroid:  Symmetrical, normal in size, without palpable masses or nodularity. Respiratory  Auscultation:  Clear without wheezing or rhonchi Cardiovascular  Auscultation:  Regular rate, without rubs, murmurs or gallops  Edema/varicosities:  Not grossly evident Abdominal  Soft,nontender, without masses, guarding or rebound.  Liver/spleen:  No organomegaly noted  Hernia:  None appreciated  Skin  Inspection:  Grossly normal   Breasts: Examined lying and sitting.   Right: Without masses, retractions, discharge or axillary adenopathy.   Left: Without masses, retractions, discharge or axillary adenopathy. Gentitourinary   Inguinal/mons:  Normal without inguinal adenopathy  External genitalia:  Normal  BUS/Urethra/Skene's glands:  Normal  Vagina:  Normal  Cervix:  Normal  Uterus:  Normal in size, shape and contour.  Midline and mobile  Adnexa/parametria:     Rt: Without masses or tenderness.   Lt: Without masses or tenderness.  Anus and perineum: Normal  Assessment/Plan:  45 y.o. [id number] for annual exam.   Well female exam with routine gynecological exam - Education provided on SBEs,  importance of preventative screenings, current guidelines, high calcium diet, regular exercise, and multivitamin daily. Labs with PCP.   Screening for cervical cancer - Normal Pap history.  Will repeat at 5-year interval per guidelines.   Screening for breast cancer - Has not had screening mammogram. Discussed current guidelines and importance of preventative screenings. Information provided on [facility]. Normal breast exam today.  Follow up in 1 year for annual.     Tamela Gammon [facility], 4:27 PM 12/26/2020

## 2020-12-26 NOTE — Patient Instructions (Addendum)
[place] [phone] [address] [place], [place] [address]  Health Maintenance, Female Adopting a healthy lifestyle and getting preventive care are important in promoting health and wellness. Ask your health care provider about:  The right schedule for you to have regular tests and exams.  Things you can do on your own to prevent diseases and keep yourself healthy. What should I know about diet, weight, and exercise? Eat a healthy diet  Eat a diet that includes plenty of vegetables, fruits, low-fat dairy products, and lean protein.  Do not eat a lot of foods that are high in solid fats, added sugars, or sodium.   Maintain a healthy weight Body mass index (BMI) is used to identify weight problems. It estimates body fat based on height and weight. Your health care provider can help determine your BMI and help you achieve or maintain a healthy weight. Get regular exercise Get regular exercise. This is one of the most important things you can do for your health. Most adults should:  Exercise for at least 150 minutes each week. The exercise should increase your heart rate and make you sweat (moderate-intensity exercise).  Do strengthening exercises at least twice a week. This is in addition to the moderate-intensity exercise.  Spend less time sitting. Even light physical activity can be beneficial. Watch cholesterol and blood lipids Have your blood tested for lipids and cholesterol at 44 years of age, then have this test every 5 years. Have your cholesterol levels checked more often if:  Your lipid or cholesterol levels are high.  You are older than 44 years of age.  You are at high risk for heart disease. What should I know about cancer screening? Depending on your health history and family history, you may need to have cancer screening at various ages. This may include screening for:  Breast cancer.  Cervical cancer.  Colorectal  cancer.  Skin cancer.  Lung cancer. What should I know about heart disease, diabetes, and high blood pressure? Blood pressure and heart disease  High blood pressure causes heart disease and increases the risk of stroke. This is more likely to develop in people who have high blood pressure readings, are of African descent, or are overweight.  Have your blood pressure checked: ? Every 3-5 years if you are 26-39 years of age. ? Every year if you are 19 years old or older. Diabetes Have regular diabetes screenings. This checks your fasting blood sugar level. Have the screening done:  Once every three years after age 77 if you are at a normal weight and have a low risk for diabetes.  More often and at a younger age if you are overweight or have a high risk for diabetes. What should I know about preventing infection? Hepatitis B If you have a higher risk for hepatitis B, you should be screened for this virus. Talk with your health care provider to find out if you are at risk for hepatitis B infection. Hepatitis C Testing is recommended for:  Everyone born from [birth year] through 1964.  Anyone with known risk factors for hepatitis C. Sexually transmitted infections (STIs)  Get screened for STIs, including gonorrhea and chlamydia, if: ? You are sexually active and are younger than 44 years of age. ? You are older than 44 years of age and your health care provider tells you that you are at risk for this type of infection. ? Your sexual activity has changed since you were  last screened, and you are at increased risk for chlamydia or gonorrhea. Ask your health care provider if you are at risk.  Ask your health care provider about whether you are at high risk for HIV. Your health care provider may recommend a prescription medicine to help prevent HIV infection. If you choose to take medicine to prevent HIV, you should first get tested for HIV. You should then be tested every 3 months for as long as  you are taking the medicine. Pregnancy  If you are about to stop having your period (premenopausal) and you may become pregnant, seek counseling before you get pregnant.  Take 400 to 800 micrograms (mcg) of folic acid every day if you become pregnant.  Ask for birth control (contraception) if you want to prevent pregnancy. Osteoporosis and menopause Osteoporosis is a disease in which the bones lose minerals and strength with aging. This can result in bone fractures. If you are 53 years old or older, or if you are at risk for osteoporosis and fractures, ask your health care provider if you should:  Be screened for bone loss.  Take a calcium or vitamin D supplement to lower your risk of fractures.  Be given hormone replacement therapy (HRT) to treat symptoms of menopause. Follow these instructions at home: Lifestyle  Do not use any products that contain nicotine or tobacco, such as cigarettes, e-cigarettes, and chewing tobacco. If you need help quitting, ask your health care provider.  Do not use street drugs.  Do not share needles.  Ask your health care provider for help if you need support or information about quitting drugs. Alcohol use  Do not drink alcohol if: ? Your health care provider tells you not to drink. ? You are pregnant, may be pregnant, or are planning to become pregnant.  If you drink alcohol: ? Limit how much you use to 0-1 drink a day. ? Limit intake if you are breastfeeding.  Be aware of how much alcohol is in your drink. In the [place] one drink equals one 12 oz bottle of beer (355 mL), one 5 oz glass of wine (148 mL), or one 1 oz glass of hard liquor (44 mL). General instructions  Schedule regular health, dental, and eye exams.  Stay current with your vaccines.  Tell your health care provider if: ? You often feel depressed. ? You have ever been abused or do not feel safe at home. Summary  Adopting a healthy lifestyle and getting preventive care are  important in promoting health and wellness.  Follow your health care provider's instructions about healthy diet, exercising, and getting tested or screened for diseases.  Follow your health care provider's instructions on monitoring your cholesterol and blood pressure. This information is not intended to replace advice given to you by your health care provider. Make sure you discuss any questions you have with your health care provider. Document Revised: 10/29/2018 Document Reviewed: 10/29/2018 Elsevier Patient Education  2021 [employer].

## 2021-02-02 ENCOUNTER — Encounter: Payer: BC Managed Care – PPO | Attending: General Surgery | Admitting: Skilled Nursing Facility1

## 2021-02-02 ENCOUNTER — Other Ambulatory Visit: Payer: Self-pay

## 2021-02-02 NOTE — Progress Notes (Signed)
Bariatric Nutrition Follow-Up Visit Medical Nutrition Therapy   Post-Operative sleeve Surgery Surgery Date: 06/06/2020  Pt given star previously: no  NUTRITION ASSESSMENT    Anthropometrics  Start weight at [facility]: 294 lbs (date: 01/19/2020) Today's weight: 245.8 lbs  Body Composition Scale 08/04/2020 11/10/2020 02/02/2021  Weight  lbs 260.2 243.1 245.8  Total Body Fat  % 44.9 43.1 43.9     Visceral Fat 15 13 14   Fat-Free Mass  % 55 56.8 56     Total Body Water  % 42 42.9 42.5     Muscle-Mass  lbs 33.5 33.5 32.9  BMI 41.9 39.1 40.6  Body Fat Displacement ---          Torso  lbs 72.5 65 67        Left Leg  lbs 14.5 13 13.4        Right Leg  lbs 14.5 13 13.4        Left Arm  lbs 7.2 6.5 6.7        Right Arm  lbs 7.2 6.5 6.7   Clinical  Medical hx:  Medications:  Labs:    Lifestyle & Dietary Hx  Pt state she recognizes she needs to be more active to have the desired weight loss she wants.    Estimated daily fluid intake: 80+ oz Estimated daily protein intake: 80 g Supplements: opurity and calcium  Current average weekly physical activity: inconsistent but has solid plans  24-Hr Dietary Recall: vegetables cooked with smoked [place] First Meal: protein shake + decaff coffee or 1 eggs + 2 bacon or sausage Snack 9am: apple + nuts or yogurt Second Meal: hummus + 1 wasa bread + feta cheese + pepporoni or salami + carrots or frozen meal Snack: deli meat or half protein shake or nuts or wisps Third Meal: salmon or chicken or pork chop + broccoli or carrots  Snack: protein yogurt (25 grams pro) or fruit or sometimes popccorn Beverages: water, water + flavorings, gatorade zero, sugar free twist  Post-Op Goals/ Signs/ Symptoms Using straws: no Drinking while eating: no Chewing/swallowing difficulties: no Changes in vision: no Changes to mood/headaches: no Hair loss/changes to skin/nails: no Difficulty focusing/concentrating: no Sweating: no Dizziness/lightheadedness:  no Palpitations: no Carbonated/caffeinated beverages: no N/V/D/C/Gas: taking mirilax Abdominal pain: no Dumping syndrome: no    NUTRITION DIAGNOSIS  Overweight/obesity ([place]-3.3) related to past poor dietary habits and physical inactivity as evidenced by completed bariatric surgery and following dietary guidelines for continued weight loss and healthy nutrition status.     NUTRITION INTERVENTION  Nutrition counseling (C-1) and education (E-2) to facilitate bariatric surgery goals, including: . The importance of consuming adequate calories as well as certain nutrients daily due to the body's need for essential vitamins, minerals, and fats . The importance of daily physical activity and to reach a goal of at least 150 minutes of moderate to vigorous physical activity weekly (or as directed by their physician) due to benefits such as increased musculature and improved lab values . The importance of intuitive eating specifically learning hunger-satiety cues and understanding the importance of learning a new body Encouraged patient to honor their body's internal hunger and fullness cues.  Throughout the day, check in mentally and rate hunger. Stop eating when satisfied not full regardless of how much food is left on the plate.  Get more if still hungry 20-30 minutes later.  The key is to honor satisfaction so throughout the meal, rate fullness factor and stop when comfortably satisfied not physically  full. The key is to honor hunger and fullness without any feelings of guilt or shame.  Pay attention to what the internal cues are, rather than any external factors. This will enhance the confidence you have in listening to your own body and following those internal cues enabling you to increase how often you eat when you are hungry not out of appetite and stop when you are satisfied not full.  Encouraged pt to continue to eat balanced meals inclusive of non starchy vegetables 2 times a day 7 days a  week Encouraged pt to choose lean protein sources: limiting beef, pork, sausage, hotdogs, and lunch meat Encourage pt to choose healthy fats such as plant based limiting animal fats . Encouraged pt to continue to drink a minium 64 fluid ounces with half being plain water to satisfy proper hydration    Handouts Provided Include     Learning Style & Readiness for Change Teaching method utilized: Visual & Auditory  Demonstrated degree of understanding via: Teach Back  Barriers to learning/adherence to lifestyle change: none identified   RD's Notes for Next Visit . Assess adherence to pt chosen goals   MONITORING & EVALUATION Dietary intake, weekly physical activity, body weight  Next Steps Patient is to follow-up for 1 year

## 2021-05-09 ENCOUNTER — Encounter: Payer: BC Managed Care – PPO | Attending: General Surgery | Admitting: Skilled Nursing Facility1

## 2021-06-19 ENCOUNTER — Other Ambulatory Visit: Payer: Self-pay

## 2021-06-19 ENCOUNTER — Encounter: Payer: BC Managed Care – PPO | Attending: General Surgery | Admitting: Skilled Nursing Facility1

## 2021-06-19 NOTE — Progress Notes (Signed)
Bariatric Nutrition Follow-Up Visit Medical Nutrition Therapy   Post-Operative sleeve Surgery Surgery Date: 06/06/2020  NUTRITION ASSESSMENT    Anthropometrics  Start weight at [facility]: 294 lbs (date: 01/19/2020) Today's weight: 241.6 lbs  Body Composition Scale 08/04/2020 11/10/2020 02/02/2021 06/19/2021  Weight  lbs 260.2 243.1 245.8 241.6  Total Body Fat  % 44.9 43.1 43.9 43.6     Visceral Fat '15 13 14 14  '$ Fat-Free Mass  % 55 56.8 56 56.3     Total Body Water  % 42 42.9 42.5 42.6     Muscle-Mass  lbs 33.5 33.5 32.9 32.7  BMI 41.9 39.1 40.6 39.9  Body Fat Displacement ---           Torso  lbs 72.5 65 67 65.4        Left Leg  lbs 14.5 13 13.4 13        Right Leg  lbs 14.5 13 13.4 13        Left Arm  lbs 7.2 6.5 6.7 6.5        Right Arm  lbs 7.2 6.5 6.7 6.5   Clinical  Medical hx:  Medications:  Labs:    Lifestyle & Dietary Hx  Pt states she is not happy with her current weight stall stating she thinks maybe a part to play is not having a schedule since being off from work for the summer and snacking more throughout the day.    Estimated daily fluid intake: 80+ oz Estimated daily protein intake: 80 g Supplements: opurity and calcium  Current average weekly physical activity: 3 days a week walking/elliptical 30-60 minutes  24-Hr Dietary Recall: vegetables cooked with smoked [place] First Meal 6am protein shake + decaf coffee Snack 9-10am: nuts or yogurt Second Meal: chicken + salad Snack: nuts or fruit or meat or pack of tuna Third Meal: chicken quesadilla  Snack: protein yogurt (25 grams pro) or fruit or sometimes popccorn Beverages: water, water + flavorings, gatorade g2 fit, sugar free twist, dacaff tea + splenda  Post-Op Goals/ Signs/ Symptoms Using straws: no Drinking while eating: no Chewing/swallowing difficulties: no Changes in vision: no Changes to mood/headaches: no Hair loss/changes to skin/nails: no Difficulty focusing/concentrating: no Sweating:  no Dizziness/lightheadedness: no Palpitations: no Carbonated/caffeinated beverages: no N/V/D/C/Gas: taking mirilax Abdominal pain: no Dumping syndrome: no    NUTRITION DIAGNOSIS  Overweight/obesity ([place]-3.3) related to past poor dietary habits and physical inactivity as evidenced by completed bariatric surgery and following dietary guidelines for continued weight loss and healthy nutrition status.     NUTRITION INTERVENTION  Nutrition counseling (C-1) and education (E-2) to facilitate bariatric surgery goals, including: The importance of consuming adequate calories as well as certain nutrients daily due to the body's need for essential vitamins, minerals, and fats The importance of daily physical activity and to reach a goal of at least 150 minutes of moderate to vigorous physical activity weekly (or as directed by their physician) due to benefits such as increased musculature and improved lab values The importance of intuitive eating specifically learning hunger-satiety cues and understanding the importance of learning a new body Encouraged patient to honor their body's internal hunger and fullness cues.  Throughout the day, check in mentally and rate hunger. Stop eating when satisfied not full regardless of how much food is left on the plate.  Get more if still hungry 20-30 minutes later.  The key is to honor satisfaction so throughout the meal, rate fullness factor and stop when comfortably satisfied not physically  full. The key is to honor hunger and fullness without any feelings of guilt or shame.  Pay attention to what the internal cues are, rather than any external factors. This will enhance the confidence you have in listening to your own body and following those internal cues enabling you to increase how often you eat when you are hungry not out of appetite and stop when you are satisfied not full.  Encouraged pt to continue to eat balanced meals inclusive of non starchy vegetables 2 times  a day 7 days a week Encouraged pt to choose lean protein sources: limiting beef, pork, sausage, hotdogs, and lunch meat Encourage pt to choose healthy fats such as plant based limiting animal fats Encouraged pt to continue to drink a minium 64 fluid ounces with half being plain water to satisfy proper hydration  Encouraged pt to log her food/beverages and seek patterns of grazing behaviors to reduce   Goals: -start logging your fluid aiming for 64 + ounces  -increase your walking pace to 16 minute mile -seek patterns of grazing behaviors to reduce    Handouts Provided Include  12 month folder   Learning Style & Readiness for Change Teaching method utilized: Visual & Auditory  Demonstrated degree of understanding via: Teach Back  Barriers to learning/adherence to lifestyle change: none identified   RD's Notes for Next Visit Assess adherence to pt chosen goals   MONITORING & EVALUATION Dietary intake, weekly physical activity, body weight  Next Steps Patient is to follow-up in 3 months

## 2021-07-31 ENCOUNTER — Other Ambulatory Visit: Payer: Self-pay

## 2021-07-31 ENCOUNTER — Emergency Department (HOSPITAL_BASED_OUTPATIENT_CLINIC_OR_DEPARTMENT_OTHER)
Admission: EM | Admit: 2021-07-31 | Discharge: 2021-07-31 | Disposition: A | Discharge status: Home / Self Care | Payer: No Typology Code available for payment source | Attending: Emergency Medicine | Admitting: Emergency Medicine

## 2021-07-31 ENCOUNTER — Emergency Department (HOSPITAL_BASED_OUTPATIENT_CLINIC_OR_DEPARTMENT_OTHER): Payer: No Typology Code available for payment source

## 2021-07-31 ENCOUNTER — Encounter (HOSPITAL_BASED_OUTPATIENT_CLINIC_OR_DEPARTMENT_OTHER): Payer: Self-pay

## 2021-07-31 DIAGNOSIS — M25562 Pain in left knee: Secondary | ICD-10-CM | POA: Diagnosis not present

## 2021-07-31 DIAGNOSIS — Y990 Civilian activity done for income or pay: Secondary | ICD-10-CM | POA: Insufficient documentation

## 2021-07-31 DIAGNOSIS — S8992XA Unspecified injury of left lower leg, initial encounter: Secondary | ICD-10-CM | POA: Diagnosis present

## 2021-07-31 DIAGNOSIS — W010XXA Fall on same level from slipping, tripping and stumbling without subsequent striking against object, initial encounter: Secondary | ICD-10-CM | POA: Insufficient documentation

## 2021-07-31 MED ORDER — OXYCODONE-ACETAMINOPHEN 5-325 MG PO TABS: 1.0000 | ORAL_TABLET | Freq: Four times a day (QID) | ORAL | 0 refills | Status: DC | PRN

## 2021-07-31 MED ORDER — ACETAMINOPHEN 500 MG PO TABS
1000.0000 mg | ORAL_TABLET | Freq: Once | ORAL | Status: AC
  Administered 2021-07-31: 1000 mg via ORAL
  Filled 2021-07-31: qty 2

## 2021-07-31 NOTE — ED Triage Notes (Signed)
Pt states she fell at work~7am-injured left knee-NAD-limping gait

## 2021-07-31 NOTE — Discharge Instructions (Addendum)
Your xray and CT scan did not show any new fractures. It does appear you may  have fractured your patella (knee bone) in the past.   Please use crutches and knee immobilizer as needed.  While at home please rest, ice, and elevate your knee to help with swelling and pain.  Take the pain medication as needed.  Follow up with your orthopedist for further eval  Return to the ED for any new/worsening symptoms

## 2021-07-31 NOTE — ED Provider Notes (Signed)
[facility] EMERGENCY DEPARTMENT Provider Note   CSN: [id number] Arrival date & time: 07/31/21  1626     History Chief Complaint  Patient presents with   Knee Injury    [name] is a 45 y.o. female who presents to the ED today for knee injury.  Patient reports she was at work today when she tripped landing directly onto her left knee.  She states that her pants left a scuff on the floor due to the impact.  She states that since that time she has had pain to her knee.  She did take some Tylenol earlier with good relief of her pain however states that once it wore off she had worsening pain.  She states pain is worse with ambulation however she is able to ambulate on it.  She does report that she follows with [facility] for arthritis bilaterally however denies any specific past injury to her knee.  She denies any weakness or numbness to her lower extremity.  No other complaints.   The history is provided by the patient and medical records.      Past Medical History:  Diagnosis Date   Arthritis    knees, spine   Bunion of left foot 01/2018    Patient Active Problem List   Diagnosis Date Noted   Vaginal inclusion cyst 07/20/2014   Fibroids 02/02/2014   Morbid obesity ([place]) 02/02/2014    Past Surgical History:  Procedure Laterality Date   BACK SURGERY     lumbar cyst excision   BUNIONECTOMY Right    BUNIONECTOMY WITH TARSAL MEDITARSAL FUSION Left 02/20/2018   Procedure: Left Lapidus; modified Alphonzo Grieve;  Surgeon: Wylene Simmer, MD;  Location: [facility];  Service: Orthopedics;  Laterality: Left;   CESAREAN SECTION  04/24/2005   CESAREAN SECTION  06/09/2012   Procedure: CESAREAN SECTION;  Surgeon: Sharene Butters, MD;  Location: [facility];  Service: Gynecology;  Laterality: N/A;  Repeat Cesarean Section Delivery Girl @ [id number], Apgars 9/9   DILATION AND EVACUATION  02/28/2011   ESOPHAGOGASTRODUODENOSCOPY N/A 06/06/2020   Procedure: UPPER  GASTROINTESTINAL ENDOSCOPY;  Surgeon: Kieth Brightly Arta Bruce, MD;  Location: [facility];  Service: General;  Laterality: N/A;   EXPLORATORY LAPAROTOMY  04/25/2004   with myomectomy   fibroidectomy     LAPAROSCOPIC GASTRIC SLEEVE RESECTION N/A 06/06/2020   Procedure: LAPAROSOCPIC SLEEVE GASTRECTOMY;  Surgeon: Mickeal Skinner, MD;  Location: [facility];  Service: General;  Laterality: N/A;   WISDOM TOOTH EXTRACTION       OB History     Gravida  4   Para  2   Term  2   Preterm      AB  2   Living  2      SAB  1   IAB  1   Ectopic      Multiple      Live Births  2           Family History  Problem Relation Age of Onset   Hypertension Father    Hypercholesterolemia Father    Diabetes Father    Cancer Maternal Grandmother        pancreatic   Cancer Maternal Grandfather        bone   Cancer Paternal Uncle        Lymphoma   Cancer Paternal Grandfather        Lung    Social History   Tobacco Use   Smoking status:  Never   Smokeless tobacco: Never  Vaping Use   Vaping Use: Never used  Substance Use Topics   Alcohol use: Not Currently   Drug use: No    Home Medications Prior to Admission medications   Medication Sig Start Date End Date Taking? Authorizing Provider  oxyCODONE-acetaminophen (PERCOCET/ROXICET) 5-325 MG tablet Take 1 tablet by mouth every 6 (six) hours as needed for severe pain. 07/31/21  Yes Dae Antonucci, PA-C  Biotin 5000 MCG TABS Take 5,000 mcg by mouth daily.    [provider]  CALCIUM PO Take by mouth.    [provider]  gabapentin (NEURONTIN) 100 MG capsule Take 2 capsules (200 mg total) by mouth every 12 (twelve) hours. Patient not taking: Reported on 12/26/2020 06/08/20   Kinsinger, Arta Bruce, MD  Multiple Vitamin (MULTIVITAMIN) tablet 1 tablet by Combination route daily.    [provider]  omeprazole (PRILOSEC) 40 MG capsule Take 40 mg by mouth daily.     [provider]    Allergies     Amoxicillin  Review of Systems   Review of Systems  Constitutional:  Negative for chills and fever.  Musculoskeletal:  Positive for arthralgias.  Neurological:  Negative for weakness and numbness.  All other systems reviewed and are negative.  Physical Exam Updated Vital Signs BP 123/74 (BP Location: Left Arm)   Pulse 63   Temp 98.4 F (36.9 C) (Oral)   Resp 16   Ht '5\' 5"'$  (1.651 m)   Wt 108.9 kg   LMP 07/17/2021   SpO2 100%   BMI 39.94 kg/m   Physical Exam Vitals and nursing note reviewed.  Constitutional:      Appearance: She is not ill-appearing or diaphoretic.  HENT:     Head: Normocephalic and atraumatic.  Eyes:     Conjunctiva/sclera: Conjunctivae normal.  Cardiovascular:     Rate and Rhythm: Normal rate and regular rhythm.  Pulmonary:     Effort: Pulmonary effort is normal.     Breath sounds: Normal breath sounds.  Musculoskeletal:     Comments: Mild swelling noted to left knee compared to R. No wound. + TTP mostly to the inferior aspect of knee along tibial plateau line. ROM intact to knee. No specific TTP to the patella itself. Strength and sensation intact. No varus or valgus laxity. 2+ DP pulse.   Skin:    General: Skin is warm and dry.     Coloration: Skin is not jaundiced.  Neurological:     Mental Status: She is alert.    ED Results / Procedures / Treatments   Labs (all labs ordered are listed, but only abnormal results are displayed) Labs Reviewed - No data to display  EKG None  Radiology CT Knee Left Wo Contrast  Result Date: 07/31/2021 CLINICAL DATA:  Left knee pain after falling this morning. Indeterminate radiographs. EXAM: CT OF THE LEFT KNEE WITHOUT CONTRAST TECHNIQUE: Multidetector CT imaging of the left knee was performed according to the standard protocol. Multiplanar CT image reconstructions were also generated. COMPARISON:  Radiographs same date. FINDINGS: Bones/Joint/Cartilage No evidence of acute fracture or dislocation. There are  age advanced tricompartmental degenerative changes with joint space narrowing and prominent osteophytes in all 3 compartments. There is deformity of the superior pole of the patella which may relate to an old fracture or a prominent osteophyte. There are several large intra-articular loose bodies posteriorly, measuring up to 2 cm on image 25/4. There is a small knee joint effusion. No significant  Baker's cyst. Ligaments Suboptimally assessed by CT. The cruciate ligaments are not well visualized. Muscles and Tendons The quadriceps and patellar tendons appear intact. No focal muscular abnormalities are seen. Soft tissues Prominent superficial varicosities laterally. No evidence of focal fluid collection or foreign body. IMPRESSION: 1. No acute findings are identified at the left knee. There is no evidence of acute fracture or dislocation. 2. 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 3. Previously noted deformity of the upper pole of the patella may relate to an old healed fracture or a prominent osteophyte. Electronically Signed   By: Richardean Sale M.D.   On: 07/31/2021 20:55   DG Knee Complete 4 Views Left  Result Date: 07/31/2021 CLINICAL DATA:  Fall, injury. EXAM: LEFT KNEE - COMPLETE 4+ VIEW COMPARISON:  None. FINDINGS: There are age-advanced tricompartmental osteoarthritic changes. There is deformity of the superior pole of the patella which may relate to a previous injury, although an acute fracture is difficult to completely exclude. There is a small knee joint effusion. The patella appears mildly subluxed laterally. No evidence of foreign body. IMPRESSION: 1. Deformity of the upper pole of the patella consistent with previous fracture, age indeterminate. Consider CT for further evaluation. 2. Age advanced tricompartmental osteoarthritis. Electronically Signed   By: Richardean Sale M.D.   On: 07/31/2021 17:19    Procedures Procedures   Medications  Ordered in ED Medications  acetaminophen (TYLENOL) tablet 1,000 mg (1,000 mg Oral Given 07/31/21 2046)    ED Course  I have reviewed the triage vital signs and the nursing notes.  Pertinent labs & imaging results that were available during my care of the patient were reviewed by me and considered in my medical decision making (see chart for details).    MDM Rules/Calculators/A&P                           45 year old female who presents to the ED today with complaint of left knee injury status post falling and landing directly on same earlier this morning.  Has been able to ambulate however some pain with same.  On arrival to the ED vitals are stable.  She did have an x-ray done which did show a deformity of the upper pole of the patella consistent with possible previous fracture although age-indeterminate.  Radiologist had recommended consideration of CT scan.  On my exam she has tenderness palpation mostly to the inferior aspect of the knee along the tibial line.  She has range of motion of the knee.  She is neurovascularly intact.  There is no specific patellar tenderness palpation however she denies previous injury to her knees.  She states that she follows with Ortho for arthritis of her knees and has had x-rays in the past without indications of patellar injury.  Given she fell directly onto her knee today with age indeterminant concern for patella fracture we will plan for CT scan for further evaluation.  Will provide Tylenol for pain.  Patient drove her self today and therefore cannot receive narcotics in the ED.  She does have a history of gastric bypass surgery and cannot take NSAIDs.   CT: IMPRESSION:  1. No acute findings are identified at the left knee. There is no  evidence of acute fracture or dislocation.  2. Age-advanced tricompartmental osteoarthritis with multiple large  intra-articular loose bodies.  3. Previously noted deformity of the upper pole of the patella may  relate  to an  old healed fracture or a prominent osteophyte.   Will discharge with knee immobilizer and crutches at this time. Pt instructed to follow up with ortho for same. She is discharged with a very short course of percocet. She is in agreement with plan and stable for discharge.   This note was prepared using Dragon voice recognition software and may include unintentional dictation errors due to the inherent limitations of voice recognition software.   Final Clinical Impression(s) / ED Diagnoses Final diagnoses:  Acute pain of left knee    Rx / DC Orders ED Discharge Orders          Ordered    oxyCODONE-acetaminophen (PERCOCET/ROXICET) 5-325 MG tablet  Every 6 hours PRN        07/31/21 2126             Discharge Instructions      Your xray and CT scan did not show any new fractures. It does appear you may  have fractured your patella (knee bone) in the past.   Please use crutches and knee immobilizer as needed.  While at home please rest, ice, and elevate your knee to help with swelling and pain.  Take the pain medication as needed.  Follow up with your orthopedist for further eval  Return to the ED for any new/worsening symptoms          Eustaquio Maize, PA-C [id number]    Campbell Stall P, DO [id number] [id number]

## 2021-09-27 ENCOUNTER — Encounter: Payer: BC Managed Care – PPO | Attending: General Surgery | Admitting: Skilled Nursing Facility1

## 2021-09-27 ENCOUNTER — Other Ambulatory Visit: Payer: Self-pay

## 2021-09-27 NOTE — Progress Notes (Signed)
Bariatric Nutrition Follow-Up Visit Medical Nutrition Therapy   Post-Operative sleeve Surgery Surgery Date: 06/06/2020  NUTRITION ASSESSMENT    Anthropometrics  Start weight at [facility]: 294 lbs (date: 01/19/2020) Today's weight: 237.5 lbs  Body Composition Scale 08/04/2020 11/10/2020 02/02/2021 06/19/2021 09/27/2021  Weight  lbs 260.2 243.1 245.8 241.6 237.5  Total Body Fat  % 44.9 43.1 43.9 43.6 42.7     Visceral Fat 15 13 14 14 13   Fat-Free Mass  % 55 56.8 56 56.3 57.2     Total Body Water  % 42 42.9 42.5 42.6 43.1     Muscle-Mass  lbs 33.5 33.5 32.9 32.7 33.2  BMI 41.9 39.1 40.6 39.9 38.2  Body Fat Displacement ---            Torso  lbs 72.5 65 67 65.4 62.8        Left Leg  lbs 14.5 13 13.4 13 12.5        Right Leg  lbs 14.5 13 13.4 13 12.5        Left Arm  lbs 7.2 6.5 6.7 6.5 6.2        Right Arm  lbs 7.2 6.5 6.7 6.5 6.2   Clinical  Medical hx:  Medications:  Labs:    Lifestyle & Dietary Hx   Pt states that she consumes approx. 45fl oz of water per day Pt states she is still Taking multivitamin and calciium  Pt states she has a Split toenail on both feet.  Pt states she is not having the wt loss that she wants.  Pt states she is not exercising as much as usual- 3 days per week 30-45 min walks; no resistance training in last month.  Bowel movements- normal  Pt states she eats out 1-2 times per wk- chicken, burger w/ no bread Pt states she could eat more Nonstarchy vegetables Pt states that she is cognizant of intake (consumption of nuts). Pt states she eats out every now and then, uses mamba sauce. Pt states she will monitor sugar and fat content.  In regards to complex CHO: pt states she is not as consistent and has days where she does not eat them.  Pt states previously with weight loss attempts she needed to watch what she ate and needed to physically active: stating she needs -3-4 miles of speed walking for the level of activity.  Pt states she fell at work and  hurt knee which has slowed her down and aggravated her arthritis over the month.   Pt states her goals are to get back into the gym and use the eliptical but her concern is her knee. Pt states that she does like to dance but will look into other options.  Pt states she would like to get to 180#  Pt states she has an active job and up all day. Would like to find things to increase her heart rate.    Estimated daily fluid intake: 80+ oz Estimated daily protein intake: 80 g Supplements: opurity and calcium  Current average weekly physical activity: 3 days a week walking/elliptical 30-60 minutes   First Meal 6a.m. Breakfast :Financial controller lovers or bacon + decaf coffee and protein shake and splenda Snack 9-10am: nuts or trail mix (turmeric) 1/2 of single serving packet Second Meal: P3 or meat and crackers Snack: nuts  Third Meal: chicken porkchop vegetable medley and mashed potatoes  Snack: triscuits, wheat thins Beverages: water, water + flavorings, sugar free twist, dacaff tea + splenda;  sugar free tropical punch    Post-Op Goals/ Signs/ Symptoms Using straws: no Drinking while eating: no Chewing/swallowing difficulties: no Changes in vision: no Changes to mood/headaches: no Hair loss/changes to skin/nails: no Difficulty focusing/concentrating: no Sweating: no Dizziness/lightheadedness: no Palpitations: no Carbonated/caffeinated beverages: no N/V/D/C/Gas: taking mirilax Abdominal pain: no Dumping syndrome: no    NUTRITION DIAGNOSIS  Overweight/obesity (-3.3) related to past poor dietary habits and physical inactivity as evidenced by completed bariatric surgery and following dietary guidelines for continued weight loss and healthy nutrition status.     NUTRITION INTERVENTION  Nutrition counseling (C-1) and education (E-2) to facilitate bariatric surgery goals, including: The importance of consuming adequate calories as well as certain nutrients daily due to the body's  need for essential vitamins, minerals, and fats The importance of daily physical activity and to reach a goal of at least 150 minutes of moderate to vigorous physical activity weekly (or as directed by their physician) due to benefits such as increased musculature and improved lab values The importance of intuitive eating specifically learning hunger-satiety cues and understanding the importance of learning a new body Encouraged patient to honor their body's internal hunger and fullness cues.  Throughout the day, check in mentally and rate hunger. Stop eating when satisfied not full regardless of how much food is left on the plate.  Get more if still hungry 20-30 minutes later.  The key is to honor satisfaction so throughout the meal, rate fullness factor and stop when comfortably satisfied not physically full. The key is to honor hunger and fullness without any feelings of guilt or shame.  Pay attention to what the internal cues are, rather than any external factors. This will enhance the confidence you have in listening to your own body and following those internal cues enabling you to increase how often you eat when you are hungry not out of appetite and stop when you are satisfied not full.  Encouraged pt to continue to eat balanced meals inclusive of non starchy vegetables 2 times a day 7 days a week Encouraged pt to choose lean protein sources: limiting beef, pork, sausage, hotdogs, and lunch meat Encourage pt to choose healthy fats such as plant based limiting animal fats Encouraged pt to continue to drink a minium 64 fluid ounces with half being plain water to satisfy proper hydration  Encouraged pt to log her food/beverages and seek patterns of grazing behaviors to reduce   Goals: continued -start logging your fluid aiming for 64 + ounces  -increase your walking pace to 16 minute mile -seek patterns of grazing behaviors to reduce    Handouts Previously Provided Include  12 month folder    Learning Style & Readiness for Change Teaching method utilized: Visual & Auditory  Demonstrated degree of understanding via: Teach Back  Barriers to learning/adherence to lifestyle change: none identified   RD's Notes for Next Visit Assess adherence to pt chosen goals   MONITORING & EVALUATION Dietary intake, weekly physical activity, body weight  Next Steps Patient is to follow-up as needed

## 2021-12-27 ENCOUNTER — Ambulatory Visit: Payer: BC Managed Care – PPO | Admitting: Nurse Practitioner

## 2021-12-27 DIAGNOSIS — Z0289 Encounter for other administrative examinations: Secondary | ICD-10-CM

## 2021-12-27 NOTE — Progress Notes (Deleted)
° °  DAMON BAISCH 1976/09/19 [id number]   History:  46 y.o. [id number] presents for annual exam without GYN complaints. Monthly cycles. Normal pap history. Has not had screening mammogram. Gastric sleeve July 2021, down about 50 pounds.   Gynecologic History No LMP recorded.   Contraception/Family planning: vasectomy Sexually active: ***  Health Maintenance Last Pap: 12/23/2019. Results were: Normal, 5-year repeat Last mammogram: Never Last colonoscopy: Not indicated Last Dexa: Not indicated  Past medical history, past surgical history, family history and social history were all reviewed and documented in the EPIC chart.  ROS:  A ROS was performed and pertinent positives and negatives are included.  Exam:  There were no vitals filed for this visit.  There is no height or weight on file to calculate BMI.  General appearance:  Normal Thyroid:  Symmetrical, normal in size, without palpable masses or nodularity. Respiratory  Auscultation:  Clear without wheezing or rhonchi Cardiovascular  Auscultation:  Regular rate, without rubs, murmurs or gallops  Edema/varicosities:  Not grossly evident Abdominal  Soft,nontender, without masses, guarding or rebound.  Liver/spleen:  No organomegaly noted  Hernia:  None appreciated  Skin  Inspection:  Grossly normal   Breasts: Examined lying and sitting.   Right: Without masses, retractions, discharge or axillary adenopathy.   Left: Without masses, retractions, discharge or axillary adenopathy. Genitourinary   Inguinal/mons:  Normal without inguinal adenopathy  External genitalia:  Normal appearing vulva with no masses, tenderness, or lesions  BUS/Urethra/Skene's glands:  Normal  Vagina:  Normal appearing with normal color and discharge, no lesions  Cervix:  Normal appearing without discharge or lesions  Uterus:  Normal in size, shape and contour.  Midline and mobile, nontender  Adnexa/parametria:     Rt: Normal in size, without masses or  tenderness.   Lt: Normal in size, without masses or tenderness.  Anus and perineum: Normal  Digital rectal exam: Normal sphincter tone without palpated masses or tenderness  Patient informed chaperone available to be present for breast and pelvic exam. Patient has requested no chaperone to be present. Patient has been advised what will be completed during breast and pelvic exam.   Assessment/Plan:  46 y.o. [id number] for annual exam.   Well female exam with routine gynecological exam - Education provided on SBEs, importance of preventative screenings, current guidelines, high calcium diet, regular exercise, and multivitamin daily. Labs with PCP.   Screening for cervical cancer - Normal Pap history.  Will repeat at 5-year interval per guidelines.   Screening for breast cancer - Has not had screening mammogram. Discussed current guidelines and importance of preventative screenings. Information provided on [facility]. Normal breast exam today.  Follow up in 1 year for annual.     Tamela Gammon [facility], 12:20 PM 12/27/2021

## 2022-01-31 ENCOUNTER — Ambulatory Visit: Payer: BC Managed Care – PPO | Admitting: Nurse Practitioner

## 2022-03-06 ENCOUNTER — Ambulatory Visit (INDEPENDENT_AMBULATORY_CARE_PROVIDER_SITE_OTHER): Payer: BC Managed Care – PPO | Admitting: Nurse Practitioner

## 2022-03-06 ENCOUNTER — Encounter: Payer: Self-pay | Admitting: Nurse Practitioner

## 2022-03-06 ENCOUNTER — Other Ambulatory Visit: Payer: Self-pay | Admitting: Nurse Practitioner

## 2022-03-06 VITALS — BP 114/70 | Ht 65.0 in | Wt 240.0 lb

## 2022-03-06 DIAGNOSIS — Z01419 Encounter for gynecological examination (general) (routine) without abnormal findings: Secondary | ICD-10-CM | POA: Diagnosis not present

## 2022-03-06 DIAGNOSIS — Z1231 Encounter for screening mammogram for malignant neoplasm of breast: Secondary | ICD-10-CM

## 2022-03-06 NOTE — Patient Instructions (Signed)
Schedule Colonoscopy! ? GI ?[phone] ?[address] , [place] [address] ? ?

## 2022-03-06 NOTE — Progress Notes (Signed)
? ?  [name] [birth year] [id number] ? ? ?History:  46 y.o. [id number] presents for annual exam. Monthly cycles. Normal pap history. Has not had screening mammogram. Gastric sleeve July 2021, down about 50-60 pounds.  ? ?Gynecologic History ?Patient's last menstrual period was 02/20/2022. ?Period Cycle (Days): 28 ?Period Duration (Days): 6 ?Period Pattern: Regular ?Menstrual Flow: Moderate ?Dysmenorrhea: None ?Contraception/Family planning: vasectomy ?Sexually active: Yes ? ?Health Maintenance ?Last Pap: 12/23/2019. Results were: Normal, 5-year repeat ?Last mammogram: Never ?Last colonoscopy: Not indicated ?Last Dexa: Not indicated ? ?Past medical history, past surgical history, family history and social history were all reviewed and documented in the EPIC chart. 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  ? ?ROS:  A ROS was performed and pertinent positives and negatives are included. ? ?Exam: ? ?Vitals:  ? 03/06/22 1550  ?BP: 114/70  ?Weight: 240 lb (108.9 kg)  ?Height: '5\' 5"'$  (1.651 m)  ? ? ?Body mass index is 39.94 kg/m?. ? ?General appearance:  Normal ?Thyroid:  Symmetrical, normal in size, without palpable masses or nodularity. ?Respiratory ? Auscultation:  Clear without wheezing or rhonchi ?Cardiovascular ? Auscultation:  Regular rate, without rubs, murmurs or gallops ? Edema/varicosities:  Not grossly evident ?Abdominal ? Soft,nontender, without masses, guarding or rebound. ? Liver/spleen:  No organomegaly noted ? Hernia:  None appreciated ? Skin ? Inspection:  Grossly normal ?  ?Breasts: Examined lying and sitting.  ? Right: Without masses, retractions, discharge or axillary adenopathy. ? ? Left: Without masses, retractions, discharge or axillary adenopathy. ?Genitourinary  ? Inguinal/mons:  Normal without inguinal adenopathy ? External genitalia:  Normal appearing vulva with no masses, tenderness, or lesions ? BUS/Urethra/Skene's glands:  Normal ? Vagina:  Normal appearing with normal  color and discharge, no lesions ? Cervix:  Normal appearing without discharge or lesions ? Uterus:  Normal in size, shape and contour.  Midline and mobile, nontender ? Adnexa/parametria:   ?  Rt: Normal in size, without masses or tenderness. ?  Lt: Normal in size, without masses or tenderness. ? Anus and perineum: Normal ? Digital rectal exam: Normal sphincter tone without palpated masses or tenderness ? ?Patient informed chaperone available to be present for breast and pelvic exam. Patient has requested no chaperone to be present. Patient has been advised what will be completed during breast and pelvic exam.  ? ?Assessment/Plan:  46 y.o. [id number] for annual exam.  ? ?Well female exam with routine gynecological exam - Education provided on SBEs, importance of preventative screenings, current guidelines, high calcium diet, regular exercise, and multivitamin daily. Labs with PCP.  ? ?Screening for cervical cancer - Normal Pap history.  Will repeat at 5-year interval per guidelines.  ? ?Screening for breast cancer - Has not had screening mammogram. Discussed current guidelines and importance of preventative screenings. Information provided on [facility]. Normal breast exam today. ? ?Screening for colon cancer - Discussed current guidelines and recommendations to start screenings at age 16. Information provided on [name] and encouraged to get colonoscopy scheduled this coming year.  ? ?Follow up in 1 year for annual.  ? ? ? ?Tamela Gammon [facility], 4:04 PM 03/06/2022 ? ?

## 2022-03-16 ENCOUNTER — Ambulatory Visit
Admission: RE | Admit: 2022-03-16 | Discharge: 2022-03-16 | Disposition: A | Discharge status: Home / Self Care | Payer: BC Managed Care – PPO | Source: Ambulatory Visit | Attending: Nurse Practitioner | Admitting: Nurse Practitioner

## 2022-03-16 ENCOUNTER — Other Ambulatory Visit: Payer: Self-pay | Admitting: Nurse Practitioner

## 2022-03-16 ENCOUNTER — Ambulatory Visit: Payer: BC Managed Care – PPO

## 2022-03-16 DIAGNOSIS — R928 Other abnormal and inconclusive findings on diagnostic imaging of breast: Secondary | ICD-10-CM

## 2022-03-16 DIAGNOSIS — Z1231 Encounter for screening mammogram for malignant neoplasm of breast: Secondary | ICD-10-CM

## 2022-04-02 ENCOUNTER — Ambulatory Visit: Payer: BC Managed Care – PPO

## 2022-04-02 ENCOUNTER — Ambulatory Visit
Admission: RE | Admit: 2022-04-02 | Discharge: 2022-04-02 | Disposition: A | Discharge status: Home / Self Care | Payer: BC Managed Care – PPO | Source: Ambulatory Visit | Attending: Nurse Practitioner | Admitting: Nurse Practitioner

## 2022-04-02 DIAGNOSIS — R928 Other abnormal and inconclusive findings on diagnostic imaging of breast: Secondary | ICD-10-CM

## 2022-08-27 ENCOUNTER — Encounter: Payer: BC Managed Care – PPO | Attending: Nurse Practitioner | Admitting: Dietician

## 2022-08-27 ENCOUNTER — Encounter: Payer: Self-pay | Admitting: Dietician

## 2022-08-27 DIAGNOSIS — E669 Obesity, unspecified: Secondary | ICD-10-CM | POA: Insufficient documentation

## 2022-08-27 NOTE — Progress Notes (Signed)
Bariatric Nutrition Follow-Up Visit Medical Nutrition Therapy   Post-Operative sleeve Surgery Surgery Date: 06/06/2020  NUTRITION ASSESSMENT  Anthropometrics  Start weight at [facility]: 294 lbs (date: 01/19/2020) Today's weight: 240.1 lbs  Body Composition Scale 08/04/2020 11/10/2020 02/02/2021 06/19/2021 09/27/2021 08/27/2022  Weight  lbs 260.2 243.1 245.8 241.6 237.5 240.1  Total Body Fat  % 44.9 43.1 43.9 43.6 42.7 43.1     Visceral Fat '15 13 14 14 13 14  '$ Fat-Free Mass  % 55 56.8 56 56.3 57.2 56.8     Total Body Water  % 42 42.9 42.5 42.6 43.1 42.9     Muscle-Mass  lbs 33.5 33.5 32.9 32.7 33.2 33.1  BMI 41.9 39.1 40.6 39.9 38.2 38.1  Body Fat Displacement ---             Torso  lbs 72.5 65 67 65.4 62.8 64.2        Left Leg  lbs 14.5 13 13.4 13 12.5 12.8        Right Leg  lbs 14.5 13 13.4 13 12.5 12.8        Left Arm  lbs 7.2 6.5 6.7 6.5 6.2 6.4        Right Arm  lbs 7.2 6.5 6.7 6.5 6.2 6.4   Clinical  Medical hx: Arthritis Medications: see list Labs:    Lifestyle & Dietary Hx  Pt states states she is discouraged, 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 Pt states she has been intense with exercise. Pt states she entered the 21 day weight challenge through [employer] with [employer]. Pt states her knee is fine, stating it is a lot better. Pt states she tries to hit zone 4 on her fit bit. Pt states she often carries her dumbbells while walking.   Pt state she just got a new treadmill and elliptical machine. Pt states she will sometimes use a protein shake as creamer in her decaf coffee.   Estimated daily fluid intake: 80+ oz Estimated daily protein intake: 80 g Supplements: opurity and calcium  Current average weekly physical activity: walking 4-5 miles 1-2 times a week, and 30 minutes other days.   First Meal 6a.m. Breakfast :apple slice, canadian bacon scrambled eggs Snack 9-10am: mini kind bar Second Meal: smart one Snack: nuts  Third Meal:  chicken, green beans, field peas, couscous or quinoa   Snack: sometimes yogurt or kind bar or Triscuits or wheat thins Beverages: water, water + flavorings, twist sugar free  Post-Op Goals/ Signs/ Symptoms Using straws: no Drinking while eating: no Chewing/swallowing difficulties: no Changes in vision: no Changes to mood/headaches: no Hair loss/changes to skin/nails: no Difficulty focusing/concentrating: no Sweating: no Dizziness/lightheadedness: no Palpitations: no Carbonated/caffeinated beverages: no N/V/D/C/Gas: taking mirilax Abdominal pain: no Dumping syndrome: no    NUTRITION DIAGNOSIS  Overweight/obesity ([place]-3.3) related to past poor dietary habits and physical inactivity as evidenced by completed bariatric surgery and following dietary guidelines for continued weight loss and healthy nutrition status.     NUTRITION INTERVENTION  Nutrition counseling (C-1) and education (E-2) to facilitate bariatric surgery goals, including: The importance of consuming adequate calories as well as certain nutrients daily due to the body's need for essential vitamins, minerals, and fats The importance of daily physical activity and to reach a goal of at least 150 minutes of moderate to vigorous physical activity weekly (or as directed by their physician) due to benefits such as increased musculature and improved lab values The importance of intuitive  eating specifically learning hunger-satiety cues and understanding the importance of learning a new body Encouraged patient to honor their body's internal hunger and fullness cues.  Throughout the day, check in mentally and rate hunger. Stop eating when satisfied not full regardless of how much food is left on the plate.  Get more if still hungry 20-30 minutes later.  The key is to honor satisfaction so throughout the meal, rate fullness factor and stop when comfortably satisfied not physically full. The key is to honor hunger and fullness without any  feelings of guilt or shame.  Pay attention to what the internal cues are, rather than any external factors. This will enhance the confidence you have in listening to your own body and following those internal cues enabling you to increase how often you eat when you are hungry not out of appetite and stop when you are satisfied not full.  Encouraged pt to continue to eat balanced meals inclusive of non starchy vegetables 2 times a day 7 days a week Encouraged pt to choose lean protein sources: limiting beef, pork, sausage, hotdogs, and lunch meat Encourage pt to choose healthy fats such as plant based limiting animal fats Encouraged pt to continue to drink a minium 64 fluid ounces with half being plain water to satisfy proper hydration  Encouraged pt to log her food/beverages and seek patterns of grazing behaviors to reduce  Why you need complex carbohydrates: Whole grains and other complex carbohydrates are required to have a healthy diet. Whole grains provide fiber which can help with blood glucose levels and help keep you satiated. Fruits and starchy vegetables provide essential vitamins and minerals required for immune function, eyesight support, brain support, bone density, wound healing and many other functions within the body. According to the current evidenced based 2020-2025 Dietary Guidelines for Americans, complex carbohydrates are part of a healthy eating pattern which is associated with a decreased risk for type 2 diabetes, cancers, and cardiovascular disease.   Goals: continued -Continue:  logging your fluid aiming for 64 + ounces  -Continue: increase your walking pace to 16 minute mile -NEW: increase resistance training -NEW/Re-engage: whole grains and whole foods/complex carbohydrates to increase fiber   Handouts Provided Include  Bariatric MyPlate Body Composition Readout  Learning Style & Readiness for Change Teaching method utilized: Visual & Auditory  Demonstrated degree of  understanding via: Teach Back  Barriers to learning/adherence to lifestyle change: none identified   RD's Notes for Next Visit Assess adherence to pt chosen goals   MONITORING & EVALUATION Dietary intake, weekly physical activity, body weight.  Next Steps Patient is to follow-up in December.

## 2022-10-04 ENCOUNTER — Encounter: Payer: Self-pay | Admitting: Dietician

## 2022-10-04 ENCOUNTER — Encounter: Payer: BC Managed Care – PPO | Attending: General Surgery | Admitting: Dietician

## 2022-10-04 VITALS — Ht 65.0 in | Wt 243.6 lb

## 2022-10-04 DIAGNOSIS — E669 Obesity, unspecified: Secondary | ICD-10-CM | POA: Diagnosis not present

## 2022-10-04 NOTE — Progress Notes (Signed)
Bariatric Nutrition Follow-Up Visit Medical Nutrition Therapy   Post-Operative sleeve surgery Surgery Date: 06/06/2020  NUTRITION ASSESSMENT  Anthropometrics  Start weight at [facility]: 294 lbs (date: 01/19/2020) Height: 65 in Today's weight: 243.6 lbs  Body Composition Scale 08/04/2020 11/10/2020 02/02/2021 06/19/2021 09/27/2021 08/27/2022 10/04/2022  Weight  lbs 260.2 243.1 245.8 241.6 237.5 240.1 243.6  Total Body Fat  % 44.9 43.1 43.9 43.6 42.7 43.1 43.4     Visceral Fat '15 13 14 14 13 14 14  '$ Fat-Free Mass  % 55 56.8 56 56.3 57.2 56.8 56.5     Total Body Water  % 42 42.9 42.5 42.6 43.1 42.9 42.7     Muscle-Mass  lbs 33.5 33.5 32.9 32.7 33.2 33.1 33.1  BMI 41.9 39.1 40.6 39.9 38.2 38.1 39.2  Body Fat Displacement ---              Torso  lbs 72.5 65 67 65.4 62.8 64.2 65.6        Left Leg  lbs 14.5 13 13.4 13 12.5 12.8 13.1        Right Leg  lbs 14.5 13 13.4 13 12.5 12.8 13.1        Left Arm  lbs 7.2 6.5 6.7 6.5 6.2 6.4 6.5        Right Arm  lbs 7.2 6.5 6.7 6.5 6.2 6.4 6.5   Clinical  Medical hx: Arthritis Medications: see list Labs:    Lifestyle & Dietary Hx  Pt states she has worked out quite a big, stating 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 Pt states she has increased her vegetables to increase fiber. Pt states she gets to a point of fullness when she eats. Pt states she tries to hit zone 4-5 on her fit bit, stating she tries to get "800 moves", stating her goal is 30 minutes of exercise a day, and states she will get more than that. Pt states she often carries her dumbbells while walking.  Pt is going great following all the maintenance phase, maintaining weight for 2 years.  Estimated daily fluid intake: 80+ oz Estimated daily protein intake: 80 g Supplements: Opurity multivitamin and calcium  Current average weekly physical activity: walking 4-5 miles 1-2 times a week, and 30 minutes other days. Increase resistance training with her sorority challenge.    First Meal 6a.m. Breakfast :apple slice or banana, canadian bacon scrambled eggs or drinkable Activia yogurt. Snack 9-10am:  Second Meal: smart one or lean cuisine or salad with chicken Snack: nuts  Third Meal: chicken or salmon or ribs, green beans, field peas, couscous or quinoa   Snack: nuts Beverages: water, water + flavorings, twist sugar free  Post-Op Goals/ Signs/ Symptoms Using straws: no Drinking while eating: no Chewing/swallowing difficulties: no Changes in vision: no Changes to mood/headaches: no Hair loss/changes to skin/nails: no Difficulty focusing/concentrating: no Sweating: no Dizziness/lightheadedness: no Palpitations: no Carbonated/caffeinated beverages: no N/V/D/C/Gas: taking mirilax Abdominal pain: no Dumping syndrome: no    NUTRITION DIAGNOSIS  Overweight/obesity ([place]-3.3) related to past poor dietary habits and physical inactivity as evidenced by completed bariatric surgery and following dietary guidelines for continued weight loss and healthy nutrition status.     NUTRITION INTERVENTION  Nutrition counseling (C-1) and education (E-2) to facilitate bariatric surgery goals, including: The importance of consuming adequate calories as well as certain nutrients daily due to the body's need for essential vitamins, minerals, and fats The importance of daily physical activity and to reach a goal of at  least 150 minutes of moderate to vigorous physical activity weekly (or as directed by their physician) due to benefits such as increased musculature and improved lab values The importance of intuitive eating specifically learning hunger-satiety cues and understanding the importance of learning a new body Encouraged pt to continue to eat balanced meals inclusive of non starchy vegetables 2 times a day 7 days a week Encouraged pt to choose lean protein sources: limiting beef, pork, sausage, hotdogs, and lunch meat Encourage pt to choose healthy fats such as plant  based limiting animal fats Encouraged pt to continue to drink a minium 64 fluid ounces with half being plain water to satisfy proper hydration   Goals: continued -Continue:  logging your fluid aiming for 64 + ounces  -Continue: increase your walking pace to 16 minute mile -Continue: increase resistance training -Continue: whole grains and whole foods/complex carbohydrates to increase fiber -New: increase cardio/walking per day to 45 minutes or and hour if possible.   Handouts Provided Include    Learning Style & Readiness for Change Teaching method utilized: Visual & Auditory  Demonstrated degree of understanding via: Teach Back  Barriers to learning/adherence to lifestyle change: none identified   RD's Notes for Next Visit Assess adherence to pt chosen goals   MONITORING & EVALUATION Dietary intake, weekly physical activity, body weight.  Next Steps Patient to follow up in January.

## 2023-03-11 ENCOUNTER — Ambulatory Visit: Payer: BC Managed Care – PPO | Admitting: Nurse Practitioner

## 2023-05-09 ENCOUNTER — Ambulatory Visit: Payer: BC Managed Care – PPO | Admitting: Nurse Practitioner

## 2023-05-09 NOTE — Progress Notes (Deleted)
   [name] [birth year] [id number]   History:  47 y.o. [id number] presents for annual exam. Monthly cycles. Normal pap history. Has not had screening mammogram. Gastric sleeve July 2021.   Gynecologic History No LMP recorded.   Contraception/Family planning: vasectomy Sexually active: Yes  Health Maintenance Last Pap: 12/23/2019. Results were: Normal, 5-year repeat Last mammogram: 03/16/2022. Results were: Left breast asymmetry, follow up imaging normal Last colonoscopy: Not indicated Last Dexa: Not indicated  Past medical history, past surgical history, family history and social history were all reviewed and documented in the EPIC chart. 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 2 children ages 46 and 39.   ROS:  A ROS was performed and pertinent positives and negatives are included.  Exam:  There were no vitals filed for this visit.   There is no height or weight on file to calculate BMI.  General appearance:  Normal Thyroid:  Symmetrical, normal in size, without palpable masses or nodularity. Respiratory  Auscultation:  Clear without wheezing or rhonchi Cardiovascular  Auscultation:  Regular rate, without rubs, murmurs or gallops  Edema/varicosities:  Not grossly evident Abdominal  Soft,nontender, without masses, guarding or rebound.  Liver/spleen:  No organomegaly noted  Hernia:  None appreciated  Skin  Inspection:  Grossly normal   Breasts: Examined lying and sitting.   Right: Without masses, retractions, discharge or axillary adenopathy.   Left: Without masses, retractions, discharge or axillary adenopathy. Genitourinary   Inguinal/mons:  Normal without inguinal adenopathy  External genitalia:  Normal appearing vulva with no masses, tenderness, or lesions  BUS/Urethra/Skene's glands:  Normal  Vagina:  Normal appearing with normal color and discharge, no lesions  Cervix:  Normal appearing without discharge or lesions  Uterus:  Normal in size, shape and contour.   Midline and mobile, nontender  Adnexa/parametria:     Rt: Normal in size, without masses or tenderness.   Lt: Normal in size, without masses or tenderness.  Anus and perineum: Normal  Digital rectal exam: Deferred  Patient informed chaperone available to be present for breast and pelvic exam. Patient has requested no chaperone to be present. Patient has been advised what will be completed during breast and pelvic exam.   Assessment/Plan:  47 y.o. [id number] for annual exam.   Well female exam with routine gynecological exam - Education provided on SBEs, importance of preventative screenings, current guidelines, high calcium diet, regular exercise, and multivitamin daily. Labs with PCP.   Screening for cervical cancer - Normal Pap history.  Will repeat at 5-year interval per guidelines.   Screening for breast cancer - Overdue and encouraged to schedule soon. Continue annual screenings. Normal breast exam today.  Screening for colon cancer - Discussed current guidelines and recommendations to start screenings at age 58. Information provided on [facility] and encouraged to get colonoscopy scheduled this coming year.   Follow up in 1 year for annual.     Olivia Mackie [facility], 11:46 AM 05/09/2023

## 2023-06-06 ENCOUNTER — Encounter: Payer: Self-pay | Admitting: *Deleted

## 2023-06-06 ENCOUNTER — Ambulatory Visit
Admission: EM | Admit: 2023-06-06 | Discharge: 2023-06-06 | Disposition: A | Discharge status: Home / Self Care | Payer: BC Managed Care – PPO

## 2023-06-06 ENCOUNTER — Ambulatory Visit: Payer: BC Managed Care – PPO

## 2023-06-06 DIAGNOSIS — S92351A Displaced fracture of fifth metatarsal bone, right foot, initial encounter for closed fracture: Secondary | ICD-10-CM | POA: Diagnosis not present

## 2023-06-06 MED ORDER — HYDROCODONE-ACETAMINOPHEN 5-325 MG PO TABS: 1.0000 | ORAL_TABLET | Freq: Four times a day (QID) | ORAL | 0 refills | Status: DC | PRN

## 2023-06-06 NOTE — ED Notes (Signed)
Pt supplied her own crutches; fit verified.

## 2023-06-06 NOTE — ED Provider Notes (Signed)
[facility]    CSN: [id number] Arrival date & time: 06/06/23  1812      History   Chief Complaint Chief Complaint  Patient presents with   Foot Injury    HPI [name] is a 47 y.o. female.   Patient presents with right foot pain after an injury that occurred around 4 PM today.  Patient reports that she was going up steps when she dropped her phone.  She decided to step down to get her phone when she accidentally twisted her foot over on the step.  Denies any ankle pain.  Denies numbness or tingling.  Has not taken any medication for pain.   Foot Injury   Past Medical History:  Diagnosis Date   Arthritis    knees, spine   Bunion of left foot 01/2018    Patient Active Problem List   Diagnosis Date Noted   Vaginal inclusion cyst 07/20/2014   Fibroids 02/02/2014   Morbid obesity (HCC) 02/02/2014    Past Surgical History:  Procedure Laterality Date   BACK SURGERY     lumbar cyst excision   BUNIONECTOMY Right    BUNIONECTOMY WITH TARSAL MEDITARSAL FUSION Left 02/20/2018   Procedure: Left Lapidus; modified Orpha Bur;  Surgeon: Toni Arthurs, MD;  Location: [facility];  Service: Orthopedics;  Laterality: Left;   CESAREAN SECTION  04/24/2005   CESAREAN SECTION  06/09/2012   Procedure: CESAREAN SECTION;  Surgeon: Mickel Baas, MD;  Location: [facility];  Service: Gynecology;  Laterality: N/A;  Repeat Cesarean Section Delivery Girl @ 1630, Apgars 9/9   DILATION AND EVACUATION  02/28/2011   ESOPHAGOGASTRODUODENOSCOPY N/A 06/06/2020   Procedure: UPPER GASTROINTESTINAL ENDOSCOPY;  Surgeon: Sheliah Hatch De Blanch, MD;  Location: [facility];  Service: General;  Laterality: N/A;   EXPLORATORY LAPAROTOMY  04/25/2004   with myomectomy   fibroidectomy     LAPAROSCOPIC GASTRIC SLEEVE RESECTION N/A 06/06/2020   Procedure: LAPAROSOCPIC SLEEVE GASTRECTOMY;  Surgeon: Rodman Pickle, MD;  Location: [facility];  Service: General;  Laterality: N/A;    WISDOM TOOTH EXTRACTION      OB History     Gravida  4   Para  2   Term  2   Preterm      AB  2   Living  2      SAB  1   IAB  1   Ectopic      Multiple      Live Births  2            Home Medications    Prior to Admission medications   Medication Sig Start Date End Date Taking? Authorizing Provider  CALCIUM PO Take by mouth.   Yes [provider]  Cholecalciferol (VITAMIN D-3 PO) Take by mouth.   Yes [provider]  Cyanocobalamin (VITAMIN B-12 PO) Take by mouth.   Yes [provider]  Ergocalciferol (VITAMIN D2 PO) Take by mouth.   Yes [provider]  Ferrous Sulfate (IRON PO) Take by mouth.   Yes [provider]  HYDROcodone-acetaminophen (NORCO/VICODIN) 5-325 MG tablet Take 1 tablet by mouth every 6 (six) hours as needed for severe pain. 06/06/23  Yes Dodi Leu, Rolly Salter E, FNP  MAGNESIUM-POTASSIUM PO Take by mouth.   Yes [provider]  Multiple Vitamin (MULTIVITAMIN) tablet 1 tablet by Combination route daily.   Yes [provider]  omeprazole (PRILOSEC) 40 MG capsule Take 40 mg by mouth daily.  Yes [provider]  phentermine (ADIPEX-P) 37.5 MG tablet Take 37.5 mg by mouth daily. 05/20/23  Yes [provider]  UNABLE TO FIND Med Name: bariatric vitamin po   Yes [provider]  UNABLE TO FIND Med Name: stool softener   Yes [provider]  Biotin 5000 MCG TABS Take 5,000 mcg by mouth daily.    [provider]  terbinafine (LAMISIL) 250 MG tablet Take 250 mg by mouth daily. 02/28/22   [provider]    Family History Family History  Problem Relation Age of Onset   Heart failure Mother    Pulmonary Hypertension Mother    Hypertension Father    Hypercholesterolemia Father    Diabetes Father    Cancer Maternal Grandmother        pancreatic   Cancer Maternal Grandfather        bone   Cancer Paternal Grandfather        Lung   Cancer  Paternal Uncle        Lymphoma   Breast cancer Neg Hx     Social History Social History   Tobacco Use   Smoking status: Never   Smokeless tobacco: Never  Vaping Use   Vaping status: Never Used  Substance Use Topics   Alcohol use: Yes    Comment: occasionally   Drug use: No     Allergies   Amoxicillin   Review of Systems Review of Systems Per HPI  Physical Exam Triage Vital Signs ED Triage Vitals  Encounter Vitals Group     BP 06/06/23 1836 100/69     Systolic BP Percentile --      Diastolic BP Percentile --      Pulse Rate 06/06/23 1836 75     Resp 06/06/23 1836 16     Temp 06/06/23 1836 (!) 97.3 F (36.3 C)     Temp Source 06/06/23 1836 Oral     SpO2 06/06/23 1836 98 %     Weight --      Height --      Head Circumference --      Peak Flow --      Pain Score 06/06/23 1838 9     Pain Loc --      Pain Education --      Exclude from Growth Chart --    No data found.  Updated Vital Signs BP 100/69   Pulse 75   Temp (!) 97.3 F (36.3 C) (Oral)   Resp 16   LMP 05/30/2023 (Exact Date)   SpO2 98%   Visual Acuity Right Eye Distance:   Left Eye Distance:   Bilateral Distance:    Right Eye Near:   Left Eye Near:    Bilateral Near:     Physical Exam Constitutional:      General: She is not in acute distress.    Appearance: Normal appearance. She is not toxic-appearing or diaphoretic.  HENT:     Head: Normocephalic and atraumatic.  Eyes:     Extraocular Movements: Extraocular movements intact.     Conjunctiva/sclera: Conjunctivae normal.  Pulmonary:     Effort: Pulmonary effort is normal.  Feet:     Comments: Patient has tenderness to palpation throughout the lateral portion of the right foot overlying the right fifth metatarsal.  Mild erythema and swelling noted.  No other tenderness to remainder of foot, toes, ankle.  Patient can wiggle toes.  Capillary refill and pulses intact.  No abrasions or  lacerations noted. Neurological:     General: No  focal deficit present.     Mental Status: She is alert and oriented to person, place, and time. Mental status is at baseline.  Psychiatric:        Mood and Affect: Mood normal.        Behavior: Behavior normal.        Thought Content: Thought content normal.        Judgment: Judgment normal.      UC Treatments / Results  Labs (all labs ordered are listed, but only abnormal results are displayed) Labs Reviewed - No data to display  EKG   Radiology DG Foot Complete Right  Result Date: 06/06/2023 CLINICAL DATA:  Fall unable to bear weight EXAM: RIGHT FOOT COMPLETE - 3+ VIEW COMPARISON:  None Available. FINDINGS: Postsurgical changes of the first metatarsal. Acute fracture involving the distal shaft of the fifth metatarsal with minimal displacement but no significant angulation. No malalignment IMPRESSION: Acute minimally displaced fracture involving the distal shaft of the fifth metatarsal. Electronically Signed   By: Jasmine Pang M.D.   On: 06/06/2023 19:29    Procedures Procedures (including critical care time)  Medications Ordered in UC Medications - No data to display  Initial Impression / Assessment and Plan / UC Course  I have reviewed the triage vital signs and the nursing notes.  Pertinent labs & imaging results that were available during my care of the patient were reviewed by me and considered in my medical decision making (see chart for details).     Patient has a very minimally displaced fracture of the right fifth metatarsal.  Cam boot applied.  Patient already has crutches available to her in urgent care at this time that she brought from home.  Advised nonweightbearing until otherwise advised by orthopedist.  Patient provided with contact information for orthopedics for follow-up in the next few days.  Advised elevation and ice application.  Patient requesting pain medication so Norco was prescribed for her.  PDMP reviewed.  Advised patient to use this sparingly as  it can make her drowsy.  Advised to not drink alcohol or drive while taking this medication.  Also educated patient that it does contain Tylenol and do not take any additional Tylenol with the norco.  Patient verbalized understanding and was agreeable with plan. Final Clinical Impressions(s) / UC Diagnoses   Final diagnoses:  Closed displaced fracture of fifth metatarsal bone of right foot, initial encounter     Discharge Instructions      You have broken your foot.  Boot has been supplied.  Use crutches until otherwise advised by orthopedist.  Call orthopedist tomorrow to schedule appointment for further evaluation and management.  Elevate extremity and apply ice.  I have prescribed you pain medication to take as needed.  Please be advised that it can make you drowsy.  Do not drive or drink alcohol with it.  Avoid Tylenol use with this medication as well as it contains Tylenol as well.     ED Prescriptions     Medication Sig Dispense Auth. Provider   HYDROcodone-acetaminophen (NORCO/VICODIN) 5-325 MG tablet Take 1 tablet by mouth every 6 (six) hours as needed for severe pain. 10 tablet [place], Acie Fredrickson, [place]      I have reviewed the PDMP during this encounter.   Gustavus Bryant, [place] 06/06/23 [phone]

## 2023-06-06 NOTE — ED Triage Notes (Signed)
Pt states she was going up steps when she stepped back bc she dropped something, and she "missed the step", causing right foot to twist. C/O pain to distal aspect of right lateral foot. RLE toes with normal sensation; states unable to move her right 5th toe.

## 2023-06-06 NOTE — Discharge Instructions (Signed)
You have broken your foot.  Boot has been supplied.  Use crutches until otherwise advised by orthopedist.  Call orthopedist tomorrow to schedule appointment for further evaluation and management.  Elevate extremity and apply ice.  I have prescribed you pain medication to take as needed.  Please be advised that it can make you drowsy.  Do not drive or drink alcohol with it.  Avoid Tylenol use with this medication as well as it contains Tylenol as well.

## 2023-07-03 ENCOUNTER — Ambulatory Visit: Payer: BC Managed Care – PPO | Admitting: Radiology

## 2023-07-03 ENCOUNTER — Encounter: Payer: Self-pay | Admitting: Radiology

## 2023-07-03 VITALS — BP 110/72 | Ht 66.0 in | Wt 238.0 lb

## 2023-07-03 DIAGNOSIS — N3000 Acute cystitis without hematuria: Secondary | ICD-10-CM | POA: Diagnosis not present

## 2023-07-03 DIAGNOSIS — Z1211 Encounter for screening for malignant neoplasm of colon: Secondary | ICD-10-CM

## 2023-07-03 DIAGNOSIS — Z01419 Encounter for gynecological examination (general) (routine) without abnormal findings: Secondary | ICD-10-CM

## 2023-07-03 LAB — URINALYSIS, COMPLETE W/RFL CULTURE
Bacteria, UA: NONE SEEN /HPF
Bilirubin Urine: NEGATIVE
Glucose, UA: NEGATIVE
Hgb urine dipstick: NEGATIVE
Hyaline Cast: NONE SEEN /LPF
Leukocyte Esterase: NEGATIVE
Nitrites, Initial: NEGATIVE
Protein, ur: NEGATIVE
RBC / HPF: NONE SEEN /HPF (ref 0–2)
Specific Gravity, Urine: 1.025 (ref 1.001–1.035)
WBC, UA: NONE SEEN /HPF (ref 0–5)
pH: 6 (ref 5.0–8.0)

## 2023-07-03 LAB — NO CULTURE INDICATED

## 2023-07-03 NOTE — Progress Notes (Signed)
   [name] [birth year] [id number]   History:  47 y.o. G4P2 presents for annual exam. Treated for uti last week, would like UA checked today to make sure it is resolved. Hx of fibroids. Denies pain or heavy periods.  Gynecologic History Patient's last menstrual period was 05/30/2023 (exact date). Period Duration (Days): 7 Period Pattern: (!) Irregular Menstrual Flow: Moderate Menstrual Control: Maxi pad, Tampon, Thin pad Dysmenorrhea: (!) Mild Dysmenorrhea Symptoms: Cramping Contraception/Family planning: vasectomy Sexually active: yes Last Pap: 2021. Results were: normal Last mammogram: 2023. Results were: normal  Obstetric History OB History  Gravida Para Term Preterm AB Living  4 2 2   2 2   SAB IAB Ectopic Multiple Live Births  1 1     2     # Outcome Date GA Lbr Len/2nd Weight Sex Type Anes PTL Lv  4 Term 06/09/12 [redacted]w[redacted]d  7 lb 8.8 oz (3.425 kg) F CS-LTranv Spinal  LIV  3 SAB 02/2011          2 Term 04/2005 [redacted]w[redacted]d  8 lb (3.629 kg) M CS-LTranv EPI  LIV  1 IAB 07/2001             The following portions of the patient's history were reviewed and updated as appropriate: allergies, current medications, past family history, past medical history, past social history, past surgical history, and problem list.  Review of Systems Pertinent items noted in HPI and remainder of comprehensive ROS otherwise negative.   Past medical history, past surgical history, family history and social history were all reviewed and documented in the EPIC chart.   Exam:  Vitals:   07/03/23 1600  BP: 110/72  Weight: 238 lb (108 kg)  Height: 5\' 6"  (1.676 m)   Body mass index is 38.41 kg/m.  General appearance:  Normal Thyroid:  Symmetrical, normal in size, without palpable masses or nodularity. Respiratory  Auscultation:  Clear without wheezing or rhonchi Cardiovascular  Auscultation:  Regular rate, without rubs, murmurs or gallops  Edema/varicosities:  Not grossly  evident Abdominal  Soft,nontender, without masses, guarding or rebound.  Liver/spleen:  No organomegaly noted  Hernia:  None appreciated  Skin  Inspection:  Grossly normal Breasts: Examined lying and sitting.   Right: Without masses, retractions, nipple discharge or axillary adenopathy.   Left: Without masses, retractions, nipple discharge or axillary adenopathy. Genitourinary   Inguinal/mons:  Normal without inguinal adenopathy  External genitalia:  Normal appearing vulva with no masses, tenderness, or lesions  BUS/Urethra/Skene's glands:  Normal without masses or exudate  Vagina:  Normal appearing with normal color and discharge, no lesions  Cervix:  Normal appearing without discharge or lesions  Uterus:  Normal in size, shape and contour.  Mobile, nontender  Adnexa/parametria:     Rt: Normal in size, without masses or tenderness.   Lt: Normal in size, without masses or tenderness.  Anus and perineum: Normal   Raynelle Fanning, CMA present for exam  Assessment/Plan:   1. Well woman exam with routine gynecological exam Pap due 2026 Mammogram overdue, advised to schedule  2. Acute cystitis without hematuria Reassured neg u/a - Urinalysis,Complete w/RFL Culture  3. Colon cancer screening - Ambulatory referral to Gastroenterology     Discussed SBE, colonoscopy and pap screening as directed/appropriate. Recommend of exercise weekly, including weight bearing exercise. Encouraged the use of seatbelts and sunscreen. Return in 1 year for annual or as needed.   Arlie Solomons B WHNP-BC 4:20 PM 07/03/2023

## 2023-08-07 IMAGING — MG MM DIGITAL DIAGNOSTIC UNILAT*L* W/ TOMO W/ CAD
4 series · 4 of 12 positions shown · non-contrast
Comparison: Screening exam, 03/16/2022.

CLINICAL DATA: Recall from baseline screening for a possible left
breast asymmetry.

EXAM:
DIGITAL DIAGNOSTIC UNILATERAL LEFT MAMMOGRAM WITH TOMOSYNTHESIS AND
CAD
TECHNIQUE: Left digital diagnostic mammography and breast tomosynthesis was
performed. The images were evaluated with computer-aided detection.

[L MLO synth-2D]
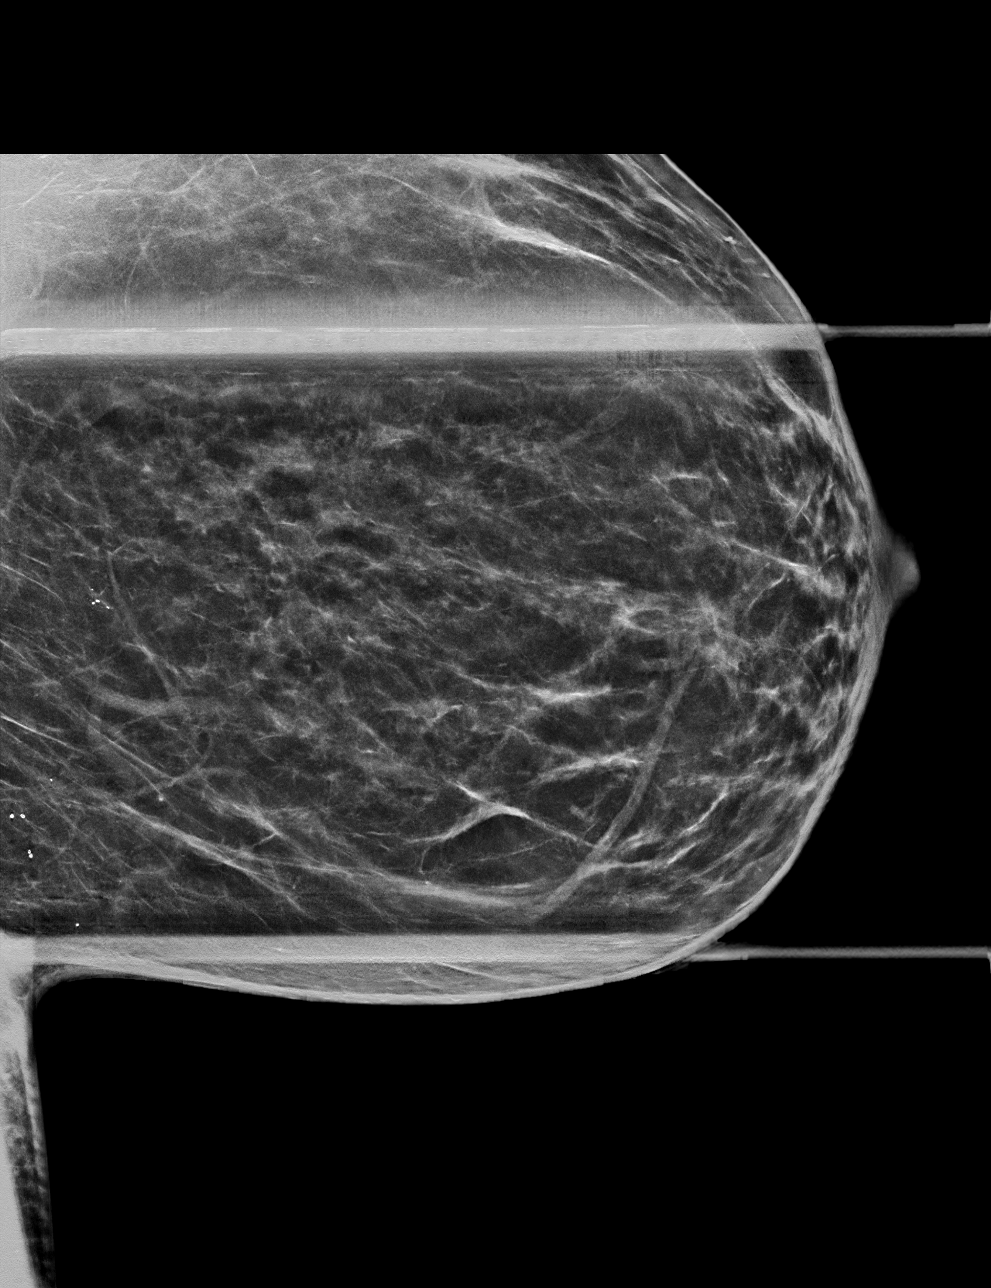

[L CC synth-2D]
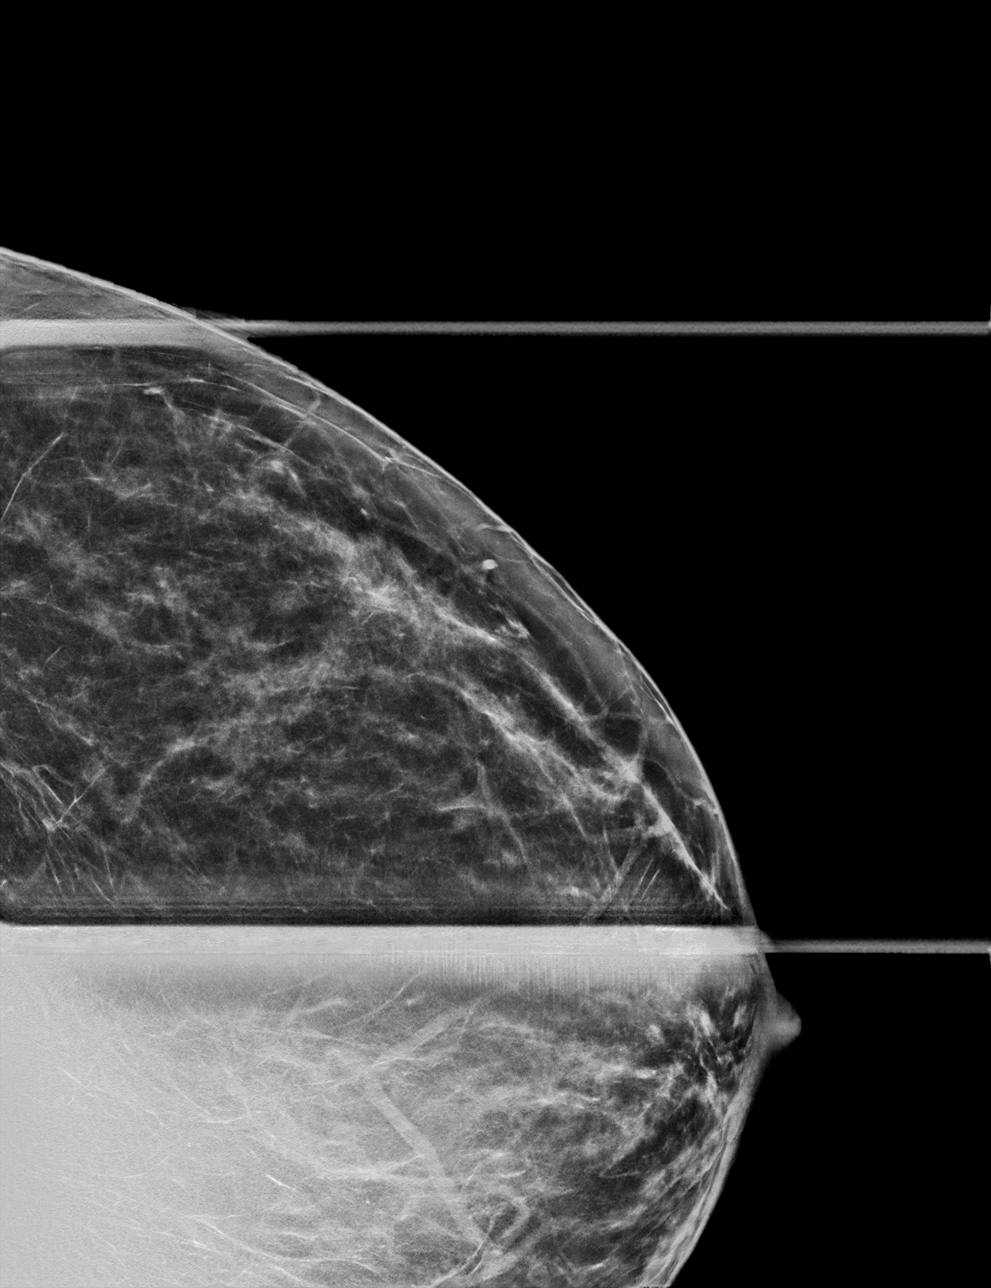

[L MLO tomo · tomo slice 34/67.0]
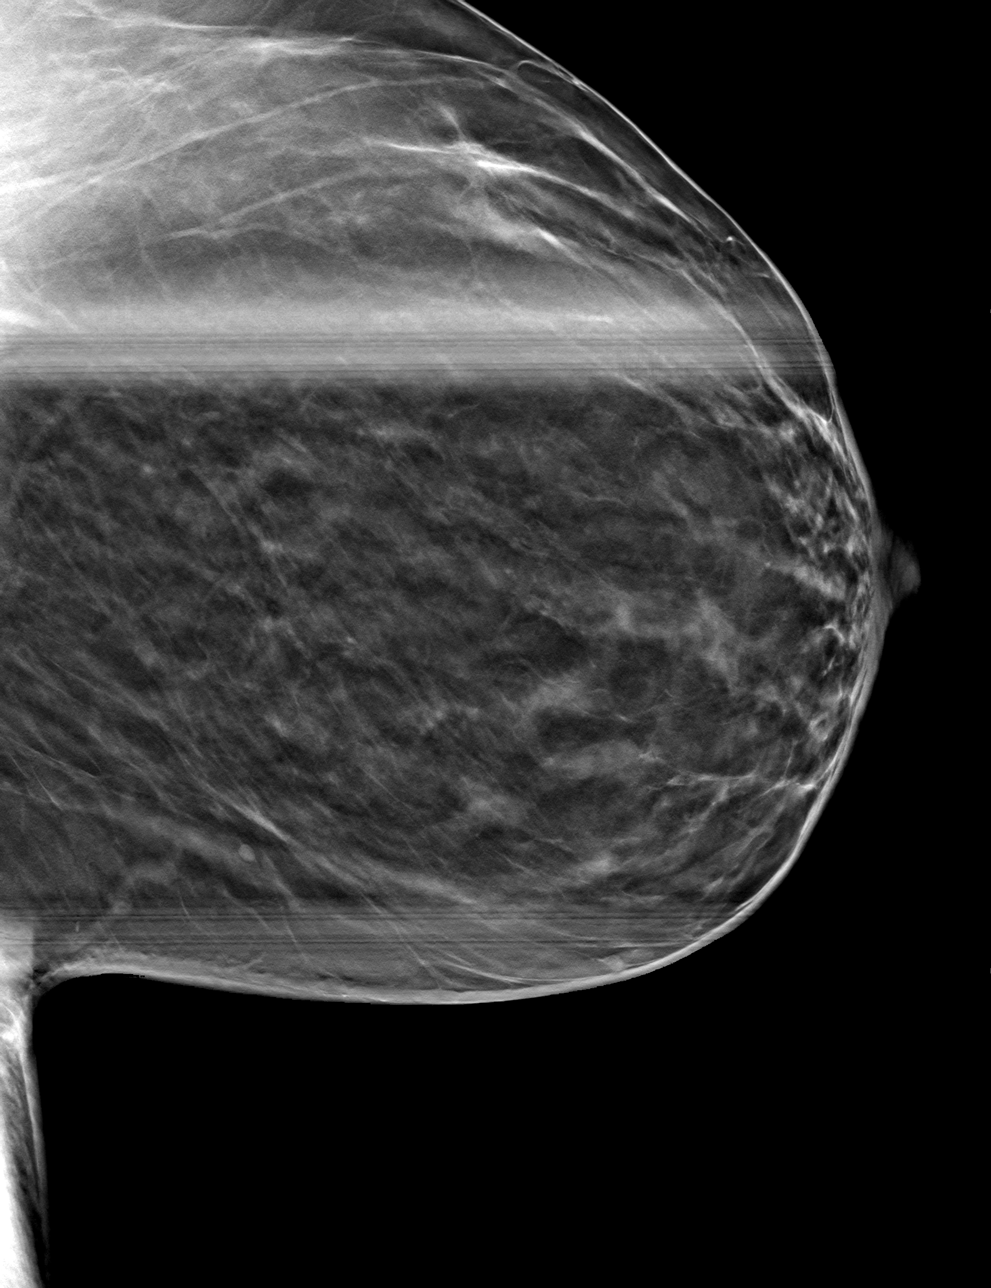

[L CC tomo · tomo slice 33/64.0]
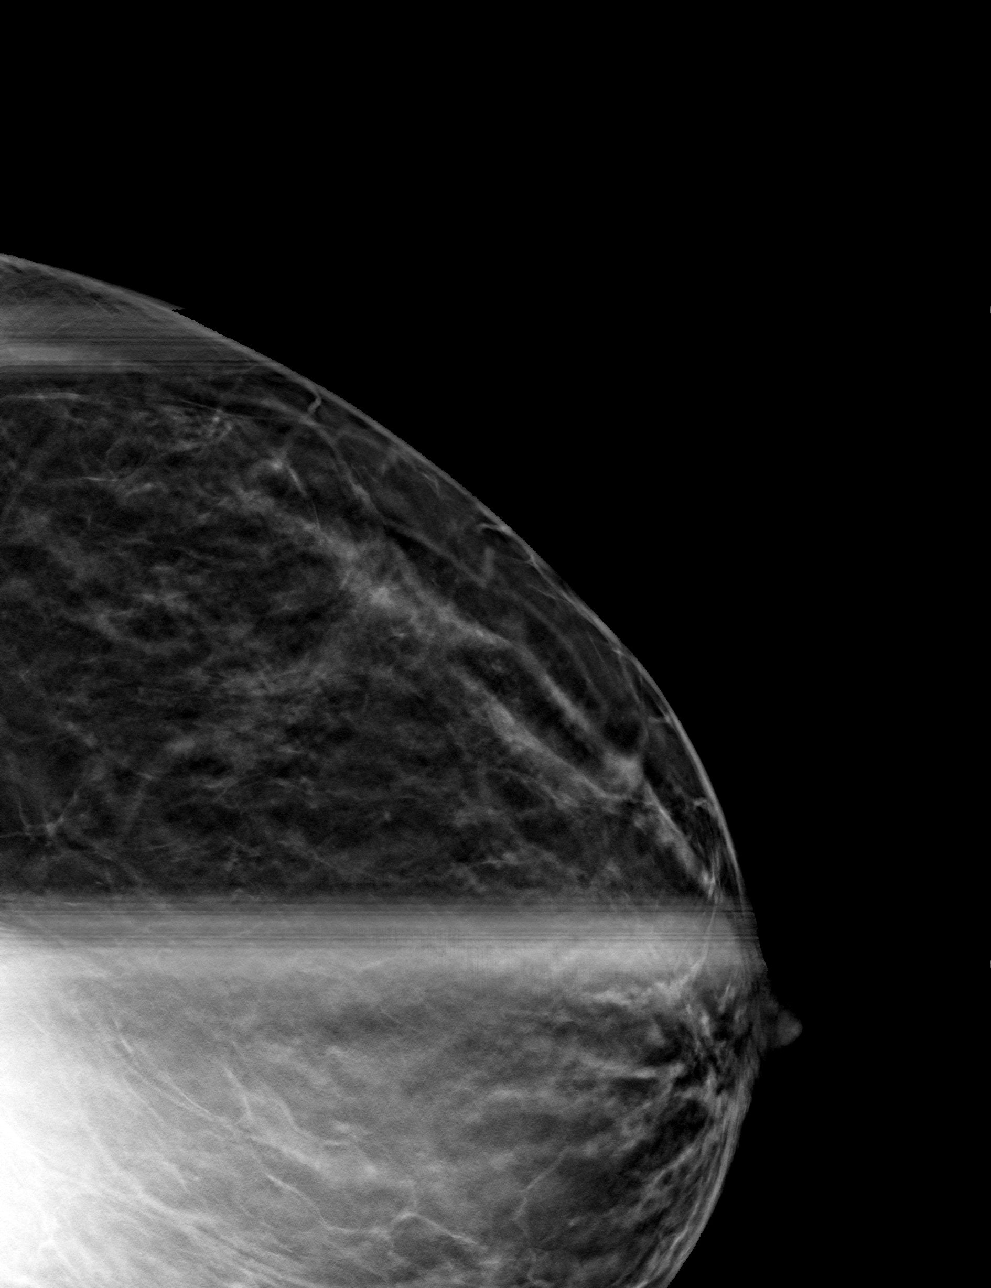

[4 of 12 positions shown; findings below may reference images not displayed]

ACR Breast Density Category b: There are scattered areas of
fibroglandular density.
FINDINGS: On spot compression imaging, the possible asymmetry noted in the
lateral left breast disperses consistent with superimposed
fibroglandular tissue. There is no underlying mass or significant
residual asymmetry. There are no areas of architectural distortion
and no suspicious calcifications.
IMPRESSION: No evidence of breast malignancy.

RECOMMENDATION:
Screening mammogram in one year.(Code:MC-[id number])

I have discussed the findings and recommendations with the patient.
If applicable, a reminder letter will be sent to the patient
regarding the next appointment.

BI-RADS CATEGORY  1: Negative.

## 2023-08-08 ENCOUNTER — Encounter: Payer: Self-pay | Admitting: Gastroenterology

## 2023-10-11 ENCOUNTER — Encounter: Payer: Self-pay | Admitting: Gastroenterology

## 2023-10-11 ENCOUNTER — Ambulatory Visit (AMBULATORY_SURGERY_CENTER): Payer: BC Managed Care – PPO | Admitting: *Deleted

## 2023-10-11 VITALS — Ht 66.0 in | Wt 242.0 lb

## 2023-10-11 DIAGNOSIS — Z1211 Encounter for screening for malignant neoplasm of colon: Secondary | ICD-10-CM

## 2023-10-11 MED ORDER — NA SULFATE-K SULFATE-MG SULF 17.5-3.13-1.6 GM/177ML PO SOLN
1.0000 | Freq: Once | ORAL | 0 refills | Status: AC
Start: 2023-10-11 — End: 2023-10-11

## 2023-10-11 NOTE — Progress Notes (Addendum)
Pt's name and DOB verified at the beginning of the pre-visit wit 2 identifiers  Pt denies any difficulty with ambulating,sitting, laying down or rolling side to side  Pt has issues with ambulation   Pt has no issues moving head neck or swallowing  No egg or soy allergy known to patient   No issues known to pt with past sedation with any surgeries or procedures  Pt denies having issues being intubated  No FH of Malignant Hyperthermia  Pt is on Phentermine instructed hold 10 days  Pt is not on home 02   Pt is not on blood thinners   Pt denies issues with constipation   Pt is not on dialysis  Pt denise any abnormal heart rhythms   Pt denies any upcoming cardiac testing  Pt encouraged to use to use Singlecare or Goodrx to reduce cost   Patient's chart reviewed by Cathlyn Parsons CNRA prior to pre-visit and patient appropriate for the [facility].  Pre-visit completed and red dot placed by patient's name on their procedure day (on provider's schedule).  .  Visit by phone  Pt states weight is 242 lb   Instructed pt why it is important to and  to call if they have any changes in health or new medications. Directed them to the # given and on instructions.     Instructions reviewed. Pt given both [facility] main # and MD on call # prior to instructions.  Pt states understanding. Instructed to review again prior to procedure. Pt states they will.   Instructions sent by mail with coupon and by My Chart   Coupon sent via text to mobile phone and pt verified they received it

## 2023-10-25 ENCOUNTER — Encounter: Payer: Self-pay | Admitting: Gastroenterology

## 2023-10-25 ENCOUNTER — Ambulatory Visit (AMBULATORY_SURGERY_CENTER): Payer: BC Managed Care – PPO | Admitting: Gastroenterology

## 2023-10-25 VITALS — BP 113/72 | HR 60 | Temp 98.2°F | Resp 23 | Ht 66.0 in | Wt 242.0 lb

## 2023-10-25 DIAGNOSIS — Z1211 Encounter for screening for malignant neoplasm of colon: Secondary | ICD-10-CM

## 2023-10-25 MED ORDER — SODIUM CHLORIDE 0.9 % IV SOLN: 500.0000 mL | Freq: Once | INTRAVENOUS | Status: DC

## 2023-10-25 NOTE — Progress Notes (Signed)
Pt's states no medical or surgical changes since previsit or office visit. 

## 2023-10-25 NOTE — Patient Instructions (Signed)
Resume previous diet. Continue present medications. Repeat colonoscopy in 10 years for screening purposes.   YOU HAD AN ENDOSCOPIC PROCEDURE TODAY AT THE [facility]:   Refer to the procedure report that was given to you for any specific questions about what was found during the examination.  If the procedure report does not answer your questions, please call your gastroenterologist to clarify.  If you requested that your care partner not be given the details of your procedure findings, then the procedure report has been included in a sealed envelope for you to review at your convenience later.  YOU SHOULD EXPECT: Some feelings of bloating in the abdomen. Passage of more gas than usual.  Walking can help get rid of the air that was put into your GI tract during the procedure and reduce the bloating. If you had a lower endoscopy (such as a colonoscopy or flexible sigmoidoscopy) you may notice spotting of blood in your stool or on the toilet paper. If you underwent a bowel prep for your procedure, you may not have a normal bowel movement for a few days.  Please Note:  You might notice some irritation and congestion in your nose or some drainage.  This is from the oxygen used during your procedure.  There is no need for concern and it should clear up in a day or so.  SYMPTOMS TO REPORT IMMEDIATELY:  Following lower endoscopy (colonoscopy or flexible sigmoidoscopy):  Excessive amounts of blood in the stool  Significant tenderness or worsening of abdominal pains  Swelling of the abdomen that is new, acute  Fever of 100F or higher  For urgent or emergent issues, a gastroenterologist can be reached at any hour by calling [phone]. Do not use MyChart messaging for urgent concerns.    DIET:  We do recommend a small meal at first, but then you may proceed to your regular diet.  Drink plenty of fluids but you should avoid alcoholic beverages for 24 hours.  ACTIVITY:  You should plan  to take it easy for the rest of today and you should NOT DRIVE or use heavy machinery until tomorrow (because of the sedation medicines used during the test).    FOLLOW UP: Our staff will call the number listed on your records the next business day following your procedure.  We will call around 7:15- 8:00 am to check on you and address any questions or concerns that you may have regarding the information given to you following your procedure. If we do not reach you, we will leave a message.     If any biopsies were taken you will be contacted by phone or by letter within the next 1-3 weeks.  Please call us at [phone] if you have not heard about the biopsies in 3 weeks.    SIGNATURES/CONFIDENTIALITY: You and/or your care partner have signed paperwork which will be entered into your electronic medical record.  These signatures attest to the fact that that the information above on your After Visit Summary has been reviewed and is understood.  Full responsibility of the confidentiality of this discharge information lies with you and/or your care-partner. 

## 2023-10-25 NOTE — Op Note (Signed)
[facility] Patient Name: [name] Procedure Date: 10/25/2023 9:37 AM MRN: [id number] Endoscopist: Sherilyn Cooter L. Myrtie Neither , MD, [id number] Age: 47 Referring MD:  Date of Birth: 02-03-1976 Gender: Female Account #: [id number] Procedure:                Colonoscopy Indications:              Screening for colorectal malignant neoplasm, This                            is the patient's first colonoscopy Medicines:                Monitored Anesthesia Care Procedure:                Pre-Anesthesia Assessment:                           - Prior to the procedure, a History and Physical                            was performed, and patient medications and                            allergies were reviewed. The patient's tolerance of                            previous anesthesia was also reviewed. The risks                            and benefits of the procedure and the sedation                            options and risks were discussed with the patient.                            All questions were answered, and informed consent                            was obtained. Prior Anticoagulants: The patient has                            taken no anticoagulant or antiplatelet agents. ASA                            Grade Assessment: II - A patient with mild systemic                            disease. After reviewing the risks and benefits,                            the patient was deemed in satisfactory condition to                            undergo the procedure.  After obtaining informed consent, the colonoscope                            was passed under direct vision. Throughout the                            procedure, the patient's blood pressure, pulse, and                            oxygen saturations were monitored continuously. The                            CF [id number] #[id number] was introduced through the anus                            and advanced to the the  cecum, identified by                            appendiceal orifice and ileocecal valve. The                            colonoscopy was somewhat difficult due to a                            redundant colon. Successful completion of the                            procedure was aided by using manual pressure and                            straightening and shortening the scope to obtain                            bowel loop reduction. The patient tolerated the                            procedure well. The quality of the bowel                            preparation was excellent. The ileocecal valve,                            appendiceal orifice, and rectum were photographed. Scope In: 9:45:38 AM Scope Out: 10:00:38 AM Scope Withdrawal Time: 0 hours 12 minutes 12 seconds  Total Procedure Duration: 0 hours 15 minutes 0 seconds  Findings:                 The perianal and digital rectal examinations were                            normal.                           Repeat examination of right colon under NBI  performed.                           The entire examined colon appeared normal on direct                            and retroflexion views. Complications:            No immediate complications. Estimated Blood Loss:     Estimated blood loss: none. Impression:               - The entire examined colon is normal on direct and                            retroflexion views.                           - No specimens collected. Recommendation:           - Patient has a contact number available for                            emergencies. The signs and symptoms of potential                            delayed complications were discussed with the                            patient. Return to normal activities tomorrow.                            Written discharge instructions were provided to the                            patient.                           - Resume  previous diet.                           - Continue present medications.                           - Repeat colonoscopy in 10 years for screening                            purposes. Qunisha Bryk L. Myrtie Neither, MD 10/25/2023 10:04:35 AM This report has been signed electronically.

## 2023-10-25 NOTE — Progress Notes (Signed)
Sedate, gd SR, tolerated procedure well, VSS, report to RN 

## 2023-10-25 NOTE — Progress Notes (Signed)
History and Physical:  This patient presents for endoscopic testing for: Encounter Diagnosis  Name Primary?   Special screening for malignant neoplasms, colon Yes    Average risk for colorectal cancer.  First screening exam.  Patient denies chronic abdominal pain, rectal bleeding, constipation or diarrhea.   Patient is otherwise without complaints or active issues today.   Past Medical History: Past Medical History:  Diagnosis Date   Arthritis    knees, spine   Bunion of left foot 01/2018   GERD (gastroesophageal reflux disease)      Past Surgical History: Past Surgical History:  Procedure Laterality Date   BACK SURGERY     lumbar cyst excision   BUNIONECTOMY Right    BUNIONECTOMY WITH TARSAL MEDITARSAL FUSION Left 02/20/2018   Procedure: Left Lapidus; modified Orpha Bur;  Surgeon: Toni Arthurs, MD;  Location: [facility];  Service: Orthopedics;  Laterality: Left;   CESAREAN SECTION  04/24/2005   CESAREAN SECTION  06/09/2012   Procedure: CESAREAN SECTION;  Surgeon: Mickel Baas, MD;  Location: [facility];  Service: Gynecology;  Laterality: N/A;  Repeat Cesarean Section Delivery Girl @ 1630, Apgars 9/9   DILATION AND EVACUATION  02/28/2011   ESOPHAGOGASTRODUODENOSCOPY N/A 06/06/2020   Procedure: UPPER GASTROINTESTINAL ENDOSCOPY;  Surgeon: Sheliah Hatch De Blanch, MD;  Location: [facility];  Service: General;  Laterality: N/A;   EXPLORATORY LAPAROTOMY  04/25/2004   with myomectomy   fibroidectomy     LAPAROSCOPIC GASTRIC SLEEVE RESECTION N/A 06/06/2020   Procedure: LAPAROSOCPIC SLEEVE GASTRECTOMY;  Surgeon: Rodman Pickle, MD;  Location: [facility];  Service: General;  Laterality: N/A;   WISDOM TOOTH EXTRACTION      Allergies: Allergies  Allergen Reactions   Amoxicillin Rash    Outpatient Meds: Current Outpatient Medications  Medication Sig Dispense Refill   CALCIUM PO Take by mouth.     omeprazole (PRILOSEC) 40 MG capsule Take 40 mg by mouth  daily.      albuterol (VENTOLIN HFA) 108 (90 Base) MCG/ACT inhaler TAKE 2 PUFFS (INHALATION) 4 TIMES PER DAY (AS NEEDED- SHORTNESS OF BREATH OR WHEEZING) FOR 2 WEEKS (Patient not taking: Reported on 10/11/2023)     Cholecalciferol (VITAMIN D-3 PO) Take by mouth.     Cyanocobalamin (VITAMIN B-12 PO) Take by mouth.     Ergocalciferol (VITAMIN D2 PO) Take by mouth.     Ferrous Sulfate (IRON PO) Take by mouth.     HYDROcodone-acetaminophen (NORCO/VICODIN) 5-325 MG tablet Take 1 tablet by mouth every 6 (six) hours as needed for severe pain. (Patient not taking: Reported on 10/25/2023) 10 tablet 0   MAGNESIUM-POTASSIUM PO Take by mouth.     Multiple Vitamin (MULTIVITAMIN) tablet 1 tablet by Combination route daily.     phentermine (ADIPEX-P) 37.5 MG tablet Take 1 tablet by mouth daily.     topiramate (TOPAMAX) 50 MG tablet 1/2 TO 1 TABLET BY MOUTH AT BEDTIME     UNABLE TO FIND Med Name: bariatric vitamin po     UNABLE TO FIND Med Name: stool softener     Current Facility-Administered Medications  Medication Dose Route Frequency Provider Last Rate Last Admin   0.9 %  sodium chloride infusion  500 mL Intravenous Once Charlie Pitter III, MD          ___________________________________________________________________ Objective   Exam:  BP 118/77   Pulse 66   Temp 98.2 F (36.8 C) (Skin)   Ht 5\' 6"  (1.676 m)   Wt 242 lb (109.8 kg)  SpO2 98%   BMI 39.06 kg/m   CV: regular , S1/S2 Resp: clear to auscultation bilaterally, normal RR and effort noted GI: soft, no tenderness, with active bowel sounds.   Assessment: Encounter Diagnosis  Name Primary?   Special screening for malignant neoplasms, colon Yes     Plan: Colonoscopy   The benefits and risks of the planned procedure were described in detail with the patient or (when appropriate) their health care proxy.  Risks were outlined as including, but not limited to, bleeding, infection, perforation, adverse medication reaction  leading to cardiac or pulmonary decompensation, pancreatitis (if ERCP).  The limitation of incomplete mucosal visualization was also discussed.  No guarantees or warranties were given.  The patient is appropriate for an endoscopic procedure in the ambulatory setting.   - Amada Jupiter, MD

## 2023-10-28 ENCOUNTER — Telehealth: Payer: Self-pay | Admitting: *Deleted

## 2023-10-28 NOTE — Telephone Encounter (Signed)
Post procedure follow up phone call. No answer at number given.  Left message on voicemail.  

## 2024-01-10 ENCOUNTER — Encounter (HOSPITAL_COMMUNITY): Payer: Self-pay | Admitting: *Deleted

## 2024-01-12 ENCOUNTER — Encounter: Payer: Self-pay | Admitting: *Deleted

## 2024-01-12 ENCOUNTER — Ambulatory Visit
Admission: EM | Admit: 2024-01-12 | Discharge: 2024-01-12 | Disposition: A | Discharge status: Home / Self Care | Payer: 59

## 2024-01-12 DIAGNOSIS — N390 Urinary tract infection, site not specified: Secondary | ICD-10-CM

## 2024-01-12 LAB — POCT URINALYSIS DIP (MANUAL ENTRY)
Bilirubin, UA: NEGATIVE
Blood, UA: NEGATIVE
Glucose, UA: NEGATIVE mg/dL
Ketones, POC UA: NEGATIVE mg/dL
Leukocytes, UA: NEGATIVE
Nitrite, UA: POSITIVE — AB
Protein Ur, POC: NEGATIVE mg/dL
Spec Grav, UA: 1.015 (ref 1.010–1.025)
Urobilinogen, UA: 0.2 U/dL
pH, UA: 7 (ref 5.0–8.0)

## 2024-01-12 MED ORDER — CEPHALEXIN 500 MG PO CAPS: 500.0000 mg | ORAL_CAPSULE | Freq: Four times a day (QID) | ORAL | 0 refills | Status: DC

## 2024-01-12 NOTE — ED Triage Notes (Signed)
 Frequency, burning, cloudy urine x 1 week

## 2024-01-12 NOTE — Discharge Instructions (Addendum)
 Return if any problems.

## 2024-01-13 ENCOUNTER — Telehealth: Payer: Self-pay | Admitting: Nurse Practitioner

## 2024-01-13 NOTE — Telephone Encounter (Signed)
  Copied from CRM [id number]. Topic: Clinical - Prescription Issue >> Jan 13, 2024  5:49 PM Alvino Blood C wrote: Reason for CRM: Patient states the following medication was prescribed to her: benzonatate (TESSALON) 200 MG capsule but she is allergic to the penicillin in the medication. Patient states she would need this medication instead  nitrofurantoin 100mg

## 2024-01-14 NOTE — ED Provider Notes (Signed)
 [facility]    CSN: [id number] Arrival date & time: 01/12/24  0850      History   Chief Complaint Chief Complaint  Patient presents with   Urinary Frequency    HPI [name] is a 48 y.o. female.   Pt complains of uti symptoms for 1 week.  Pt reports burning and frequently.  Pt denies fever or chills.  No abdominal pain   The history is provided by the patient. No language interpreter was used.  Urinary Frequency    Past Medical History:  Diagnosis Date   Arthritis    knees, spine   Bunion of left foot 01/2018   GERD (gastroesophageal reflux disease)     Patient Active Problem List   Diagnosis Date Noted   Vaginal inclusion cyst 07/20/2014   Fibroids 02/02/2014   Morbid obesity (HCC) 02/02/2014    Past Surgical History:  Procedure Laterality Date   BACK SURGERY     lumbar cyst excision   BUNIONECTOMY Right    BUNIONECTOMY WITH TARSAL MEDITARSAL FUSION Left 02/20/2018   Procedure: Left Lapidus; modified Orpha Bur;  Surgeon: Toni Arthurs, MD;  Location: [facility];  Service: Orthopedics;  Laterality: Left;   CESAREAN SECTION  04/24/2005   CESAREAN SECTION  06/09/2012   Procedure: CESAREAN SECTION;  Surgeon: Mickel Baas, MD;  Location: [facility];  Service: Gynecology;  Laterality: N/A;  Repeat Cesarean Section Delivery Girl @ 1630, Apgars 9/9   DILATION AND EVACUATION  02/28/2011   ESOPHAGOGASTRODUODENOSCOPY N/A 06/06/2020   Procedure: UPPER GASTROINTESTINAL ENDOSCOPY;  Surgeon: Sheliah Hatch De Blanch, MD;  Location: [facility];  Service: General;  Laterality: N/A;   EXPLORATORY LAPAROTOMY  04/25/2004   with myomectomy   fibroidectomy     LAPAROSCOPIC GASTRIC SLEEVE RESECTION N/A 06/06/2020   Procedure: LAPAROSOCPIC SLEEVE GASTRECTOMY;  Surgeon: Rodman Pickle, MD;  Location: [facility];  Service: General;  Laterality: N/A;   WISDOM TOOTH EXTRACTION      OB History     Gravida  4   Para  2   Term  2   Preterm       AB  2   Living  2      SAB  1   IAB  1   Ectopic      Multiple      Live Births  2            Home Medications    Prior to Admission medications   Medication Sig Start Date End Date Taking? Authorizing Provider  benzonatate (TESSALON) 200 MG capsule Take 200 mg by mouth 3 (three) times daily. 01/08/24  Yes [provider]  CALCIUM PO Take by mouth.   Yes [provider]  cephALEXin (KEFLEX) 500 MG capsule Take 1 capsule (500 mg total) by mouth 4 (four) times daily. 01/12/24  Yes Elson Areas, PA-C  Cholecalciferol (VITAMIN D-3 PO) Take by mouth.   Yes [provider]  Cyanocobalamin (VITAMIN B-12 PO) Take by mouth.   Yes [provider]  Ergocalciferol (VITAMIN D2 PO) Take by mouth.   Yes [provider]  Ferrous Sulfate (IRON PO) Take by mouth.   Yes [provider]  ipratropium (ATROVENT) 0.03 % nasal spray Place 2 sprays into both nostrils 2 (two) times daily. 01/08/24  Yes [provider]  Multiple Vitamin (MULTIVITAMIN) tablet 1 tablet by Combination route daily.   Yes [provider]  naproxen (NAPROSYN) 500 MG tablet Take  500 mg by mouth 2 (two) times daily. 01/08/24  Yes [provider]  omeprazole (PRILOSEC) 40 MG capsule Take 40 mg by mouth daily.    Yes [provider]  phentermine (ADIPEX-P) 37.5 MG tablet Take 1 tablet by mouth daily.   Yes [provider]  topiramate (TOPAMAX) 50 MG tablet 1/2 TO 1 TABLET BY MOUTH AT BEDTIME   Yes [provider]  UNABLE TO FIND Med Name: bariatric vitamin po   Yes [provider]  albuterol (VENTOLIN HFA) 108 (90 Base) MCG/ACT inhaler TAKE 2 PUFFS (INHALATION) 4 TIMES PER DAY (AS NEEDED- SHORTNESS OF BREATH OR WHEEZING) FOR 2 WEEKS Patient not taking: Reported on 10/11/2023    [provider]  HYDROcodone-acetaminophen (NORCO/VICODIN) 5-325 MG tablet Take 1 tablet by mouth every 6 (six) hours as  needed for severe pain. Patient not taking: Reported on 10/25/2023 06/06/23   Gustavus Bryant, FNP  MAGNESIUM-POTASSIUM PO Take by mouth.    [provider]  UNABLE TO FIND Med Name: stool softener    [provider]    Family History Family History  Problem Relation Age of Onset   Heart failure Mother    Pulmonary Hypertension Mother    Hypertension Father    Hypercholesterolemia Father    Diabetes Father    Cancer Paternal Uncle        Lymphoma   Cancer Maternal Grandmother        pancreatic   Cancer Maternal Grandfather        bone   Cancer Paternal Grandfather        Lung   Breast cancer Neg Hx    Colon cancer Neg Hx    Colon polyps Neg Hx    Esophageal cancer Neg Hx    Stomach cancer Neg Hx    Rectal cancer Neg Hx     Social History Social History   Tobacco Use   Smoking status: Never    Passive exposure: Never   Smokeless tobacco: Never  Vaping Use   Vaping status: Never Used  Substance Use Topics   Alcohol use: Yes    Comment: occasionally   Drug use: No     Allergies   Amoxicillin   Review of Systems Review of Systems  Genitourinary:  Positive for frequency.  All other systems reviewed and are negative.    Physical Exam Triage Vital Signs ED Triage Vitals [01/12/24 1022]  Encounter Vitals Group     BP 112/78     Systolic BP Percentile      Diastolic BP Percentile      Pulse Rate 73     Resp 18     Temp 97.9 F (36.6 C)     Temp Source Oral     SpO2 100 %     Weight      Height      Head Circumference      Peak Flow      Pain Score      Pain Loc      Pain Education      Exclude from Growth Chart    No data found.  Updated Vital Signs BP 112/78 (BP Location: Left Arm)   Pulse 73   Temp 97.9 F (36.6 C) (Oral)   Resp 18   LMP 12/25/2023   SpO2 100%   Visual Acuity Right Eye Distance:   Left Eye Distance:   Bilateral Distance:    Right Eye Near:   Left Eye  Near:    Bilateral Near:     Physical  Exam Vitals and nursing note reviewed.  Constitutional:      Appearance: She is well-developed.  HENT:     Head: Normocephalic.  Cardiovascular:     Rate and Rhythm: Normal rate.  Pulmonary:     Effort: Pulmonary effort is normal.  Abdominal:     General: There is no distension.  Skin:    General: Skin is warm.  Neurological:     General: No focal deficit present.     Mental Status: She is alert and oriented to person, place, and time.      UC Treatments / Results  Labs (all labs ordered are listed, but only abnormal results are displayed) Labs Reviewed  POCT URINALYSIS DIP (MANUAL ENTRY) - Abnormal; Notable for the following components:      Result Value   Clarity, UA hazy (*)    Nitrite, UA Positive (*)    All other components within normal limits    EKG   Radiology No results found.  Procedures Procedures (including critical care time)  Medications Ordered in UC Medications - No data to display  Initial Impression / Assessment and Plan / UC Course  I have reviewed the triage vital signs and the nursing notes.  Pertinent labs & imaging results that were available during my care of the patient were reviewed by me and considered in my medical decision making (see chart for details).     Pt counseled on uti  Rx for keflex  Final Clinical Impressions(s) / UC Diagnoses   Final diagnoses:  Urinary tract infection without hematuria, site unspecified     Discharge Instructions      Return if any problems.     ED Prescriptions     Medication Sig Dispense Auth. Provider   cephALEXin (KEFLEX) 500 MG capsule Take 1 capsule (500 mg total) by mouth 4 (four) times daily. 28 capsule Elson Areas, [place]      PDMP not reviewed this encounter. An After Visit Summary was printed and given to the patient.       Elson Areas, [place] 01/14/24 2126

## 2024-02-10 ENCOUNTER — Institutional Professional Consult (permissible substitution): Admitting: Plastic Surgery

## 2024-03-26 ENCOUNTER — Institutional Professional Consult (permissible substitution): Admitting: Plastic Surgery

## 2024-04-21 ENCOUNTER — Ambulatory Visit (INDEPENDENT_AMBULATORY_CARE_PROVIDER_SITE_OTHER): Admitting: Family Medicine

## 2024-04-21 ENCOUNTER — Encounter (INDEPENDENT_AMBULATORY_CARE_PROVIDER_SITE_OTHER): Payer: Self-pay | Admitting: Family Medicine

## 2024-04-21 VITALS — BP 99/67 | HR 76 | Temp 97.9°F | Ht 65.0 in | Wt 240.0 lb

## 2024-04-21 DIAGNOSIS — E669 Obesity, unspecified: Secondary | ICD-10-CM

## 2024-04-21 DIAGNOSIS — K219 Gastro-esophageal reflux disease without esophagitis: Secondary | ICD-10-CM | POA: Insufficient documentation

## 2024-04-21 DIAGNOSIS — Z6839 Body mass index (BMI) 39.0-39.9, adult: Secondary | ICD-10-CM | POA: Diagnosis not present

## 2024-04-21 DIAGNOSIS — M170 Bilateral primary osteoarthritis of knee: Secondary | ICD-10-CM | POA: Diagnosis not present

## 2024-04-21 DIAGNOSIS — Z0289 Encounter for other administrative examinations: Secondary | ICD-10-CM

## 2024-04-21 DIAGNOSIS — E66812 Obesity, class 2: Secondary | ICD-10-CM | POA: Insufficient documentation

## 2024-04-21 NOTE — Progress Notes (Signed)
 Office: [phone]  /  Fax: [phone]  WEIGHT SUMMARY AND BIOMETRICS  Anthropometric Measurements Height: 5\' 5"  (1.651 m) Weight: 240 lb (108.9 kg) BMI (Calculated): 39.94 Peak Weight: 306 lb   Body Composition  Body Fat %: 43.6 % Fat Mass (lbs): 104.8 lbs Muscle Mass (lbs): 128.6 lbs Total Body Water  (lbs): 97.4 lbs Visceral Fat Rating : 12   Other Clinical Data Fasting: no Labs: no Today's Visit #: info session Comments: info session    Chief Complaint: OBESITY   History of Present Illness [name] is a 48 year old female who presents for a consultation regarding her weight management.  She has a history of obesity, currently weighing 240 pounds, with a peak weight of 306 pounds. She underwent a gastric sleeve procedure in 2021, which initially resulted in weight loss, but her weight has since plateaued. Her lowest weight post-surgery was 232 pounds, and she fluctuates between 232 and 242 pounds despite efforts such as increased water  intake and walking.  She is currently taking phentermine and topiramate. Hunger is not a significant issue, but she struggles with snacking, particularly with bread. She is working on increasing her intake of fruits and vegetables and has started meal prepping to better manage her diet.  Her past medical history includes knee pain and a cyst on her back, for which she had surgery. Her knees have improved, though they occasionally flare up due to arthritis and weather changes.  She experiences heartburn, which worsened after her weight loss surgery. The reflux is exacerbated when she lies on her left side or eats before bed. She avoids onions and tomatoes unless they are well-cooked, as they are not well-tolerated.  No history of diabetes, hypertension, high cholesterol, or sleep apnea. 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 She has recently started meal prepping for herself and  her children, which she finds beneficial.      PHYSICAL EXAM:  Blood pressure 99/67, pulse 76, temperature 97.9 F (36.6 C), height 5\' 5"  (1.651 m), weight 240 lb (108.9 kg), SpO2 98%. Body mass index is 39.94 kg/m.  DIAGNOSTIC DATA REVIEWED:  BMET    Component Value Date/Time   NA 137 06/07/2020 0747   K 3.6 06/07/2020 0747   CL 100 06/07/2020 0747   CO2 24 06/07/2020 0747   GLUCOSE 118 (H) 06/07/2020 0747   BUN 8 06/07/2020 0747   CREATININE 0.80 06/07/2020 0747   CREATININE 0.92 11/16/2014 1516   CALCIUM 9.4 06/07/2020 0747   GFRNONAA >60 06/07/2020 0747   GFRAA >60 06/07/2020 0747   Lab Results  Component Value Date   HGBA1C 5.3 11/16/2014   No results found for: "INSULIN" Lab Results  Component Value Date   TSH 2.488 11/16/2014   CBC    Component Value Date/Time   WBC 8.6 06/08/2020 0504   RBC 4.25 06/08/2020 0504   HGB 12.7 06/08/2020 0504   HCT 39.5 06/08/2020 0504   PLT 274 06/08/2020 0504   MCV 92.9 06/08/2020 0504   MCH 29.9 06/08/2020 0504   MCHC 32.2 06/08/2020 0504   RDW 12.9 06/08/2020 0504   Iron Studies No results found for: "IRON", "TIBC", "FERRITIN", "IRONPCTSAT" Lipid Panel  No results found for: "CHOL", "TRIG", "HDL", "CHOLHDL", "VLDL", "LDLCALC", "LDLDIRECT" Hepatic Function Panel     Component Value Date/Time   PROT 7.9 06/07/2020 0747   ALBUMIN 3.9 06/07/2020 0747   AST 25 06/07/2020 0747   ALT  24 06/07/2020 0747   ALKPHOS 76 06/07/2020 0747   BILITOT 0.6 06/07/2020 0747      Component Value Date/Time   TSH 2.488 11/16/2014 1516   Nutritional No results found for: "VD25OH"   Assessment and Plan Assessment & Plan Obesity She has class III obesity with a BMI of 40.0, a peak weight of 306 pounds, and a current weight of 240 pounds. After a gastric sleeve procedure in 2021 and treatment with phentermine and topiramate, her weight plateaued at 232 pounds. She is not interested in further surgery and is exploring other  options. Obesity is treated as a disease with genetic factors and mechanisms that resist weight loss, such as decreased metabolism, increased hunger, and enhanced fat storage. She is implementing dietary changes, including increased fruits and vegetables, meal prepping, and reduced bread intake. - Schedule initial workup with fasting requirements and extensive testing, including labs, EKG, and metabolism test. - Initiate a structured eating plan with regular foods tailored to address weight loss resistance mechanisms. - Follow up every two weeks for the first three months to adjust the plan as needed. - Discuss potential use of medications to aid weight loss if appropriate. - Consider alternatives to phentermine if it is not addressing underlying issues.  Gastroesophageal Reflux Disease (GERD) She experiences worsening reflux symptoms, particularly when lying on her left side or eating before bed. She avoids onions and tomatoes as they exacerbate her symptoms. Weight loss is expected to improve her reflux symptoms. - Incorporate reflux management into the weight loss treatment plan. - Advise avoiding eating at least two hours before bedtime and elevating the head during sleep.  Knee Osteoarthritis She has knee pain due to osteoarthritis, which flares up with changing weather. Weight loss is expected to alleviate pressure on her knees and improve symptoms. - Include knee osteoarthritis management in the weight loss treatment plan.     I have personally spent 38 minutes total time today in preparation, patient care, and documentation for this visit, including the following: review of clinical lab tests; review of medical history, review of weight history and treatments she has tried. We reviewed the pathophysiology of obesity and potential treatment options.   She was informed of the importance of frequent follow up visits to maximize her success with intensive lifestyle modifications for her  multiple health conditions.    Jasmine Mesi, MD

## 2024-04-28 ENCOUNTER — Institutional Professional Consult (permissible substitution): Admitting: Plastic Surgery

## 2024-05-05 ENCOUNTER — Institutional Professional Consult (permissible substitution): Admitting: Plastic Surgery

## 2024-06-09 ENCOUNTER — Encounter (INDEPENDENT_AMBULATORY_CARE_PROVIDER_SITE_OTHER): Payer: Self-pay

## 2024-06-22 ENCOUNTER — Encounter (INDEPENDENT_AMBULATORY_CARE_PROVIDER_SITE_OTHER): Payer: Self-pay | Admitting: Family Medicine

## 2024-06-22 ENCOUNTER — Encounter (INDEPENDENT_AMBULATORY_CARE_PROVIDER_SITE_OTHER): Payer: Self-pay

## 2024-06-22 ENCOUNTER — Encounter (INDEPENDENT_AMBULATORY_CARE_PROVIDER_SITE_OTHER): Admitting: Family Medicine

## 2024-06-22 ENCOUNTER — Ambulatory Visit (INDEPENDENT_AMBULATORY_CARE_PROVIDER_SITE_OTHER): Admitting: Family Medicine

## 2024-06-22 VITALS — BP 107/75 | HR 66 | Temp 97.9°F | Ht 66.0 in | Wt 234.0 lb

## 2024-06-22 DIAGNOSIS — Z9884 Bariatric surgery status: Secondary | ICD-10-CM

## 2024-06-22 DIAGNOSIS — E66812 Obesity, class 2: Secondary | ICD-10-CM | POA: Diagnosis not present

## 2024-06-22 DIAGNOSIS — Z1331 Encounter for screening for depression: Secondary | ICD-10-CM

## 2024-06-22 DIAGNOSIS — K219 Gastro-esophageal reflux disease without esophagitis: Secondary | ICD-10-CM | POA: Diagnosis not present

## 2024-06-22 DIAGNOSIS — E785 Hyperlipidemia, unspecified: Secondary | ICD-10-CM | POA: Diagnosis not present

## 2024-06-22 DIAGNOSIS — Z6837 Body mass index (BMI) 37.0-37.9, adult: Secondary | ICD-10-CM

## 2024-06-22 DIAGNOSIS — R5383 Other fatigue: Secondary | ICD-10-CM | POA: Diagnosis not present

## 2024-06-22 DIAGNOSIS — R0602 Shortness of breath: Secondary | ICD-10-CM

## 2024-06-22 DIAGNOSIS — E669 Obesity, unspecified: Secondary | ICD-10-CM

## 2024-06-22 NOTE — Assessment & Plan Note (Signed)
 Patient reports elevation in cholesterol previously but isn't sure which one or how much.  Will repeat FLP today.

## 2024-06-22 NOTE — Assessment & Plan Note (Addendum)
 On omeprazole for heartburn and reports good control with this medication.  She cannot drink water  and lay down or she will get sick.

## 2024-06-22 NOTE — Assessment & Plan Note (Signed)
 Patient had gastric sleeve in 2021 and lost from a bit over 300lbs to 230.  She has been fluctuating between her current weight and about 10lbs heavier since that time.  She still has a pretty significant intake restriction.

## 2024-06-22 NOTE — Progress Notes (Signed)
 Chief Complaint:  Obesity   Subjective:  [name] (MR# [id number]) is a 48 y.o. female who presents for evaluation and treatment of obesity and related comorbidities.   [name] is currently in the action stage of change and ready to dedicate time achieving and maintaining a healthier weight. [name] is interested in becoming our patient and working on intensive lifestyle modifications including (but not limited to) diet and exercise for weight loss.  [name] has been struggling with her weight. She has been unsuccessful in either losing weight, maintaining weight loss, or reaching her healthy weight goal.  Is currently on phentermine and mentions she was able to lose weight on this but not much.  She is also taking topiramate for weight loss.  She works out with a group on M/W/F and she mentions she can't get too aggressive or she has to cut it back due to soreness in her knees.  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  Living at home with husband [name], son [name] (51) and daughter [name] (46).  Everyone will be changing how they eat with her and everyone is supportive of her.  Desired weight is 180lbs.  Last time she was that weight was between [age] years old.  Previously tried intermittent fasting and is doing that currently.  Patients mother is in a rehab in [place] currently to get her knees stronger to be able to fight Multiple Myeloma that she was just diagnosed with. She is going to [facility] daily.   Food Recall: Overnight oats (1/2 cup) with yogurt (1/2 cup), silk almond milk (1/3 cup), strawberries and blueberries (1/4 cup).  Felt full after eating half.  Around 12pm she had half of a malawi sandwich (3 slices malawi, 1 slice swiss cheese, lettuce, small bit mayo) and small salad with skinny poppyseed.  Felt satisfied.  On the way to this appointment she had a large apple because she was hungry.  Dinner will be baked chicken (1 thigh) and green beans  (1/2 cup).  Feel full. No food after dinner.   Indirect Calorimeter completed today shows a RMR: 1498.  Her calculated basal metabolic rate is 8096 thus her basal metabolic rate is worse than expected.  Other Fatigue Keylie denies daytime somnolence and denies waking up still tired. Patient denies a history of symptoms of OSA. [name] generally gets 6 or 7 hours of sleep per night, and states that she has generally restful sleep. Snoring is not present. Apneic episodes are not present. Epworth Sleepiness Score is 8.   Shortness of Breath [name] notes increasing shortness of breath with exercising and seems to be worsening over time with weight gain. She notes getting out of breath sooner with activity than she used to. This has not gotten worse recently. [name] denies shortness of breath at rest or orthopnea.  Depression Screen [name]'s Food and Mood (modified PHQ-9) score was 5.     06/22/2024    9:15 AM  Depression screen PHQ 2/9  Decreased Interest 0  Down, Depressed, Hopeless 1  PHQ - 2 Score 1  Altered sleeping 2  Tired, decreased energy 1  Change in appetite 1  Feeling bad or failure about yourself  0  Trouble concentrating 0  Moving slowly or fidgety/restless 0  Suicidal thoughts 0  PHQ-9 Score 5  Difficult doing work/chores Not difficult at all     Objective:  Vitals Temp: 97.9 F (36.6 C) BP: 107/75 Pulse Rate: 66 SpO2: 100 %  Anthropometric Measurements Height: 5' 6 (1.676 m) Weight: 234 lb (106.1 kg) BMI (Calculated): 37.79 Starting Weight: 234 lb Waist Measurement : 42 inches   Body Composition  Body Fat %: 41.6 % Fat Mass (lbs): 97.6 lbs Muscle Mass (lbs): 130.2 lbs Total Body Water  (lbs): 90.8 lbs Visceral Fat Rating : 11   Other Clinical Data RMR: 1498 Fasting: yes Labs: yes Today's Visit #: 1 Starting Date: 06/22/24    EKG: Normal sinus rhythm, rate 67.  General: Cooperative, alert, well developed, in no acute distress. HEENT:  Conjunctivae and lids unremarkable. Cardiovascular: Regular rhythm.  Lungs: Normal work of breathing. Neurologic: No focal deficits.   Lab Results  Component Value Date   CREATININE 0.80 06/07/2020   BUN 8 06/07/2020   NA 137 06/07/2020   K 3.6 06/07/2020   CL 100 06/07/2020   CO2 24 06/07/2020   Lab Results  Component Value Date   ALT 24 06/07/2020   AST 25 06/07/2020   ALKPHOS 76 06/07/2020   BILITOT 0.6 06/07/2020   Lab Results  Component Value Date   HGBA1C 5.3 11/16/2014   No results found for: INSULIN  Lab Results  Component Value Date   TSH 2.488 11/16/2014   No results found for: CHOL, HDL, LDLCALC, LDLDIRECT, TRIG, CHOLHDL Lab Results  Component Value Date   WBC 8.6 06/08/2020   HGB 12.7 06/08/2020   HCT 39.5 06/08/2020   MCV 92.9 06/08/2020   PLT 274 06/08/2020   No results found for: IRON, TIBC, FERRITIN  Assessment and Plan:   Other Fatigue  Mecca does feel that her weight is causing her energy to be lower than it should be. Fatigue may be related to obesity, depression or many other causes. Labs will be ordered, and in the meanwhile, [name] will focus on self care including making healthy food choices, increasing physical activity and focusing on stress reduction.  Shortness of Breath  Patina does feel that she gets out of breath more easily that she used to when she exercises. Payge's shortness of breath appears to be obesity related and exercise induced. She has agreed to work on weight loss and gradually increase exercise to treat her exercise induced shortness of breath. Will continue to monitor closely.   Problem List Items Addressed This Visit       Digestive   Gastroesophageal reflux disease   On omeprazole for heartburn and reports good control with this medication.  She cannot drink water  and lay down or she will get sick.        Other   H/O bariatric surgery   Patient had gastric sleeve in 2021 and lost from a bit  over 300lbs to 230.  She has been fluctuating between her current weight and about 10lbs heavier since that time.  She still has a pretty significant intake restriction.      Relevant Orders   Vitamin B12   Comprehensive metabolic panel with GFR   Folate   VITAMIN D  25 Hydroxy (Vit-D Deficiency, Fractures)   Anemia panel   CBC w/Diff/Platelet   HLD (hyperlipidemia)   Patient reports elevation in cholesterol previously but isn't sure which one or how much.  Will repeat FLP today.      Other Visit Diagnoses       Other fatigue    -  Primary   Relevant Orders   EKG 12-Lead   Hemoglobin A1c   Insulin , random   Thyroid  Panel With TSH     SOBOE (shortness of  breath on exertion)       Relevant Orders   Lipid Panel With LDL/HDL Ratio     Depression screening         Class 2 severe obesity with serious comorbidity and body mass index (BMI) of 37.0 to 37.9 in adult, unspecified obesity type [facility])           Vonette is currently in the action stage of change and her goal is to continue with weight loss efforts. I recommend Viridiana begin the structured treatment plan as follows:  She has agreed to Category 2 Plan  Exercise goals: All adults should avoid inactivity. Some activity is better than none, and adults who participate in any amount of physical activity, gain some health benefits.  Behavioral modification strategies:increasing lean protein intake, decreasing simple carbohydrates, increasing vegetables, meal planning and cooking strategies, keeping healthy foods in the home, and planning for success  She was informed of the importance of frequent follow-up visits to maximize her success with intensive lifestyle modifications for her multiple health conditions. She was informed we would discuss her lab results at her next visit unless there is a critical issue that needs to be addressed sooner. Berkley agreed to keep her next visit at the agreed upon time to discuss these results.  Labs  ordered with plans to discuss at the next visit.   Attestation Statements:  Reviewed by clinician on day of visit: allergies, medications, problem list, medical history, surgical history, family history, social history, and previous encounter notes. This is the patient's first visit at [facility]. The patient's NEW PATIENT PACKET was reviewed at length. Included in the packet: current and past health history, medications, allergies, ROS, gynecologic history (women only), surgical history, family history, social history, weight history, weight loss surgery history (for those that have had weight loss surgery), nutritional evaluation, mood and food questionnaire, PHQ9, Epworth questionnaire, sleep habits questionnaire, patient life and health improvement goals questionnaire. These will all be scanned into the patient's chart under media.   During the visit, I independently reviewed the patient's EKG, bioimpedance scale results, and indirect calorimeter results. I used this information to tailor a meal plan for the patient that will help her to lose weight and will improve her obesity-related conditions going forward. I performed a medically necessary appropriate examination and/or evaluation. I discussed the assessment and treatment plan with the patient. The patient was provided an opportunity to ask questions and all were answered. The patient agreed with the plan and demonstrated an understanding of the instructions. Labs were ordered at this visit and will be reviewed at the next visit unless more critical results need to be addressed immediately. Clinical information was updated and documented in the EMR.   Time spent on visit including pre-visit chart review and post-visit charting and care was 40 minutes.   Adelita Cho, MD

## 2024-06-23 ENCOUNTER — Encounter (INDEPENDENT_AMBULATORY_CARE_PROVIDER_SITE_OTHER): Payer: Self-pay | Admitting: Family Medicine

## 2024-06-23 LAB — CBC WITH DIFFERENTIAL/PLATELET
Basophils Absolute: 0 x10E3/uL (ref 0.0–0.2)
Basos: 0 %
EOS (ABSOLUTE): 0 x10E3/uL (ref 0.0–0.4)
Eos: 0 %
Hematocrit: 43 % (ref 34.0–46.6)
Hemoglobin: 13.6 g/dL (ref 11.1–15.9)
Immature Grans (Abs): 0 x10E3/uL (ref 0.0–0.1)
Immature Granulocytes: 0 %
Lymphocytes Absolute: 1.3 x10E3/uL (ref 0.7–3.1)
Lymphs: 25 %
MCH: 30.2 pg (ref 26.6–33.0)
MCHC: 31.6 g/dL (ref 31.5–35.7)
MCV: 96 fL (ref 79–97)
Monocytes Absolute: 0.3 x10E3/uL (ref 0.1–0.9)
Monocytes: 5 %
Neutrophils Absolute: 3.6 x10E3/uL (ref 1.4–7.0)
Neutrophils: 70 %
Platelets: 257 x10E3/uL (ref 150–450)
RBC: 4.5 x10E6/uL (ref 3.77–5.28)
RDW: 11.9 % (ref 11.7–15.4)
WBC: 5.3 x10E3/uL (ref 3.4–10.8)

## 2024-06-23 LAB — VITAMIN B12: Vitamin B-12: 698 pg/mL (ref 232–1245)

## 2024-06-23 LAB — LIPID PANEL WITH LDL/HDL RATIO
Cholesterol, Total: 215 mg/dL — ABNORMAL HIGH (ref 100–199)
HDL: 76 mg/dL (ref >39)
LDL Chol Calc (NIH): 127 mg/dL — ABNORMAL HIGH (ref 0–99)
LDL/HDL Ratio: 1.7 ratio (ref 0.0–3.2)
Triglycerides: 68 mg/dL (ref 0–149)
VLDL Cholesterol Cal: 12 mg/dL (ref 5–40)

## 2024-06-23 LAB — COMPREHENSIVE METABOLIC PANEL WITH GFR
ALT: 28 IU/L (ref 0–32)
AST: 37 IU/L (ref 0–40)
Albumin: 4.5 g/dL (ref 3.9–4.9)
Alkaline Phosphatase: 99 IU/L (ref 44–121)
BUN/Creatinine Ratio: 11 (ref 9–23)
BUN: 12 mg/dL (ref 6–24)
Bilirubin Total: 0.4 mg/dL (ref 0.0–1.2)
CO2: 23 mmol/L (ref 20–29)
Calcium: 9.8 mg/dL (ref 8.7–10.2)
Chloride: 103 mmol/L (ref 96–106)
Creatinine, Ser: 1.05 mg/dL — ABNORMAL HIGH (ref 0.57–1.00)
Globulin, Total: 3.3 g/dL (ref 1.5–4.5)
Glucose: 76 mg/dL (ref 70–99)
Potassium: 4.1 mmol/L (ref 3.5–5.2)
Sodium: 142 mmol/L (ref 134–144)
Total Protein: 7.8 g/dL (ref 6.0–8.5)
eGFR: 66 mL/min/1.73 (ref >59)

## 2024-06-23 LAB — THYROID PANEL WITH TSH
Free Thyroxine Index: 1.9 (ref 1.2–4.9)
T3 Uptake Ratio: 29 % (ref 24–39)
T4, Total: 6.7 ug/dL (ref 4.5–12.0)
TSH: 2.3 u[IU]/mL (ref 0.450–4.500)

## 2024-06-23 LAB — HEMOGLOBIN A1C
Est. average glucose Bld gHb Est-mCnc: 97 mg/dL
Hgb A1c MFr Bld: 5 % (ref 4.8–5.6)

## 2024-06-23 LAB — VITAMIN D 25 HYDROXY (VIT D DEFICIENCY, FRACTURES): Vit D, 25-Hydroxy: 71.2 ng/mL (ref 30.0–100.0)

## 2024-06-23 LAB — FOLATE: Folate: 11.4 ng/mL (ref >3.0)

## 2024-06-23 LAB — INSULIN, RANDOM: INSULIN: 6.9 u[IU]/mL (ref 2.6–24.9)

## 2024-06-24 ENCOUNTER — Ambulatory Visit (INDEPENDENT_AMBULATORY_CARE_PROVIDER_SITE_OTHER): Admitting: Family Medicine

## 2024-07-03 ENCOUNTER — Institutional Professional Consult (permissible substitution): Admitting: Plastic Surgery

## 2024-07-06 ENCOUNTER — Ambulatory Visit (INDEPENDENT_AMBULATORY_CARE_PROVIDER_SITE_OTHER): Admitting: Family Medicine

## 2024-07-06 VITALS — BP 106/72 | HR 73 | Temp 98.1°F | Ht 66.0 in | Wt 236.0 lb

## 2024-07-06 DIAGNOSIS — E66812 Obesity, class 2: Secondary | ICD-10-CM | POA: Diagnosis not present

## 2024-07-06 DIAGNOSIS — Z6838 Body mass index (BMI) 38.0-38.9, adult: Secondary | ICD-10-CM

## 2024-07-06 DIAGNOSIS — Z9884 Bariatric surgery status: Secondary | ICD-10-CM

## 2024-07-06 DIAGNOSIS — E785 Hyperlipidemia, unspecified: Secondary | ICD-10-CM | POA: Diagnosis not present

## 2024-07-06 DIAGNOSIS — E559 Vitamin D deficiency, unspecified: Secondary | ICD-10-CM | POA: Diagnosis not present

## 2024-07-06 NOTE — Progress Notes (Unsigned)
 SUBJECTIVE:  Chief Complaint: Obesity  Interim History: Since last appointment patient's mother died and her dad's brother died.  Patient mentions she is feeling some closure.  Last two days she deviated off plan but prior to that she mentions she did well following plan.  When on meal plan she enjoyed the plan- she substituted overnight oats for regular oatmeal.  She voices that she did well otherwise on her meal plan.  Felt fuller longer when eating on plan. She did mostly chicken and fish.  When she ate fried food she noticed that is when her weight went up.  Over the next few weeks she is going to the beach on August 13/14, family reunion on the 20th and an awards ceremony recognizing.    Harmani is here to discuss her progress with her obesity treatment plan. She is on the Category 2 Plan and states she is following her eating plan approximately 100 % of the time. She states she is exercising 60 minutes 3 times per week.   OBJECTIVE: Visit Diagnoses: Problem List Items Addressed This Visit       Other   Class 2 severe obesity with serious comorbidity and body mass index (BMI) of 39.0 to 39.9 in adult [facility]   H/O bariatric surgery   HLD (hyperlipidemia) - Primary   Other Visit Diagnoses       BMI 38.0-38.9,adult           Vitals Temp: 98.1 F (36.7 C) BP: 106/72 Pulse Rate: 73 SpO2: 98 %   Anthropometric Measurements Height: 5' 6 (1.676 m) Weight: 236 lb (107 kg) BMI (Calculated): 38.11 Weight at Last Visit: 234 lb Weight Lost Since Last Visit: 0 Weight Gained Since Last Visit: 2 Starting Weight: 234 lb   Body Composition  Body Fat %: 39.1 % Fat Mass (lbs): 92.4 lbs Muscle Mass (lbs): 136.8 lbs Total Body Water  (lbs): 90.6 lbs Visceral Fat Rating : 11   Other Clinical Data Today's Visit #: 2 Starting Date: 06/22/24 Comments: Cat 2     ASSESSMENT AND PLAN: Assessment & Plan Hyperlipidemia, unspecified hyperlipidemia type The 10-year ASCVD risk  score (Arnett DK, et al., 2019) is: 0.4%   Values used to calculate the score:     Age: 48 years     Clincally relevant sex: Female     Is Non-Hispanic African American: Yes     Diabetic: No     Tobacco smoker: No     Systolic Blood Pressure: 106 mmHg     Is BP treated: No     HDL Cholesterol: 76 mg/dL     Total Cholesterol: 215 mg/dL 89-bzjm ASCVD risk for less than 1% patient to continue to work on lifestyle changes. H/O bariatric surgery Possible vitamin aberrancies checked on initial labs done at prior appointment and all were within normal limits.  Patient to continue taking bariatric multivitamin.  Continue to monitor vitamins and prealbumin level to ensure adequate nutrition as patient continues to lose weight. Vitamin D  deficiency Patient is on over-the-counter vitamin D  supplementation and level is at goal between 60 and 80.  She was encouraged to continue her current supplementation regime and we will repeat labs in 3 to 4 months. Class 2 severe obesity with serious comorbidity and body mass index (BMI) of 39.0 to 39.9 in adult, unspecified obesity type (HCC)  BMI 38.0-38.9,adult    Diet: Jerika is currently in the action stage of change. As such, her goal is to continue with weight loss  efforts and has agreed to the Category 2 Plan.   Exercise:  All adults should avoid inactivity. Some activity is better than none, and adults who participate in any amount of physical activity, gain some health benefits.  Behavior Modification:  We discussed the following Behavioral Modification Strategies today: increasing lean protein intake, decreasing simple carbohydrates, meal planning and cooking strategies, and celebration eating strategies.   Return in about 3 weeks (around 07/27/2024).   She was informed of the importance of frequent follow up visits to maximize her success with intensive lifestyle modifications for her multiple health conditions.  Attestation Statements:    Reviewed by clinician on day of visit: allergies, medications, problem list, medical history, surgical history, family history, social history, and previous encounter notes.     Adelita Cho, MD

## 2024-07-06 NOTE — Assessment & Plan Note (Signed)
 The 10-year ASCVD risk score (Arnett DK, et al., 2019) is: 0.4%   Values used to calculate the score:     Age: 48 years     Clincally relevant sex: Female     Is Non-Hispanic African American: Yes     Diabetic: No     Tobacco smoker: No     Systolic Blood Pressure: 106 mmHg     Is BP treated: No     HDL Cholesterol: 76 mg/dL     Total Cholesterol: 215 mg/dL 89-bzjm ASCVD risk for less than 1% patient to continue to work on lifestyle changes.

## 2024-07-07 NOTE — Assessment & Plan Note (Signed)
 Possible vitamin aberrancies checked on initial labs done at prior appointment and all were within normal limits.  Patient to continue taking bariatric multivitamin.  Continue to monitor vitamins and prealbumin level to ensure adequate nutrition as patient continues to lose weight.

## 2024-07-22 ENCOUNTER — Ambulatory Visit (INDEPENDENT_AMBULATORY_CARE_PROVIDER_SITE_OTHER): Admitting: Family Medicine

## 2024-07-22 ENCOUNTER — Encounter (INDEPENDENT_AMBULATORY_CARE_PROVIDER_SITE_OTHER): Payer: Self-pay | Admitting: Family Medicine

## 2024-07-22 VITALS — BP 113/77 | HR 77 | Temp 97.8°F | Ht 66.0 in | Wt 236.0 lb

## 2024-07-22 DIAGNOSIS — Z6838 Body mass index (BMI) 38.0-38.9, adult: Secondary | ICD-10-CM | POA: Diagnosis not present

## 2024-07-22 DIAGNOSIS — E559 Vitamin D deficiency, unspecified: Secondary | ICD-10-CM

## 2024-07-22 DIAGNOSIS — E66812 Obesity, class 2: Secondary | ICD-10-CM | POA: Diagnosis not present

## 2024-07-22 DIAGNOSIS — N951 Menopausal and female climacteric states: Secondary | ICD-10-CM | POA: Diagnosis not present

## 2024-07-22 NOTE — Progress Notes (Unsigned)
 SUBJECTIVE:  Chief Complaint: Obesity  Interim History: Patient has been trying to follow the plan as best as she can.  She is trying to deal with her mother's death and running back and forth to [facility]. Last 3 days eating has gotten better and she is walking more in the afternoon.  She has more leisure time. Over the next 3 weeks she is anticipating have to travel back and forth to [facility] frequently again.  Barrett is here to discuss her progress with her obesity treatment plan. She is on the Category 2 Plan and states she is following her eating plan approximately 30 % of the time. She states she is exercising 60 minutes 3 times per week.   OBJECTIVE: Visit Diagnoses: Problem List Items Addressed This Visit       Other   Class 2 severe obesity with serious comorbidity and body mass index (BMI) of 39.0 to 39.9 in adult [facility])   Other Visit Diagnoses       Vitamin D  deficiency    -  Primary     BMI 38.0-38.9,adult           Vitals Temp: 97.8 F (36.6 C) BP: 113/77 Pulse Rate: 77 SpO2: 100 %   Anthropometric Measurements Height: 5' 6 (1.676 m) Weight: 236 lb (107 kg) BMI (Calculated): 38.11 Weight at Last Visit: 236 lb Weight Lost Since Last Visit: 0 Weight Gained Since Last Visit: 0 Starting Weight: 234 lb Total Weight Loss (lbs): 0 lb (0 kg)   Body Composition  Body Fat %: 39.4 % Fat Mass (lbs): 93.2 lbs Muscle Mass (lbs): 136.4 lbs Total Body Water  (lbs): 93.2 lbs Visceral Fat Rating : 11   Other Clinical Data Today's Visit #: 3 Starting Date: 06/22/24 Comments: Cat 2     ASSESSMENT AND PLAN: Assessment & Plan Vitamin D  deficiency Tolerating prescription strength Vitamin D .  No nausea, vomiting or muscle weakness.  No refill needed today. Perimenopausal symptoms Patient is experiencing some vasomotor symptoms of menopause and we discussed pharmacologic treatment options.  She is going to follow up with her gyn to discuss hormone  replacement as well as topical estrogen usage.  Will discuss treatment plan after that appointment. Class 2 severe obesity with serious comorbidity and body mass index (BMI) of 39.0 to 39.9 in adult, unspecified obesity type (HCC)  BMI 38.0-38.9,adult    Diet: Tymesha is currently in the action stage of change. As such, her goal is to continue with weight loss efforts and has agreed to the Category 2 Plan or 1150-1300 calories and 85 or more grams of protein daily.   Exercise:  For substantial health benefits, adults should do at least 150 minutes (2 hours and 30 minutes) a week of moderate-intensity, or 75 minutes (1 hour and 15 minutes) a week of vigorous-intensity aerobic physical activity, or an equivalent combination of moderate- and vigorous-intensity aerobic activity. Aerobic activity should be performed in episodes of at least 10 minutes, and preferably, it should be spread throughout the week.  Behavior Modification:  We discussed the following Behavioral Modification Strategies today: increasing lean protein intake, decreasing simple carbohydrates, increasing vegetables, meal planning and cooking strategies, and planning for success. We discussed various medication options to help Aune with her weight loss efforts and we both agreed to stop phentermine as patient was experiencing symptoms of dehydration.  Return in about 3 weeks (around 08/12/2024).   She was informed of the importance of frequent follow up visits to maximize  her success with intensive lifestyle modifications for her multiple health conditions.  Attestation Statements:   Reviewed by clinician on day of visit: allergies, medications, problem list, medical history, surgical history, family history, social history, and previous encounter notes.   Adelita Cho, MD

## 2024-07-25 NOTE — Progress Notes (Signed)
 Appointment canceled.

## 2024-07-28 ENCOUNTER — Ambulatory Visit (INDEPENDENT_AMBULATORY_CARE_PROVIDER_SITE_OTHER): Admitting: Family Medicine

## 2024-08-10 ENCOUNTER — Encounter (INDEPENDENT_AMBULATORY_CARE_PROVIDER_SITE_OTHER): Payer: Self-pay | Admitting: Family Medicine

## 2024-08-10 ENCOUNTER — Ambulatory Visit (INDEPENDENT_AMBULATORY_CARE_PROVIDER_SITE_OTHER): Admitting: Family Medicine

## 2024-08-10 VITALS — BP 113/80 | HR 70 | Temp 97.6°F | Ht 66.0 in | Wt 238.0 lb

## 2024-08-10 DIAGNOSIS — Z6838 Body mass index (BMI) 38.0-38.9, adult: Secondary | ICD-10-CM | POA: Diagnosis not present

## 2024-08-10 DIAGNOSIS — E66812 Obesity, class 2: Secondary | ICD-10-CM | POA: Diagnosis not present

## 2024-08-10 DIAGNOSIS — F4321 Adjustment disorder with depressed mood: Secondary | ICD-10-CM | POA: Diagnosis not present

## 2024-08-10 DIAGNOSIS — E785 Hyperlipidemia, unspecified: Secondary | ICD-10-CM | POA: Diagnosis not present

## 2024-08-10 NOTE — Progress Notes (Signed)
 SUBJECTIVE:  Chief Complaint: Obesity  Interim History: Patient is still going through the grieving process.  She doesn't think she is eating enough.  Today she has not yet eaten lunch and it is 3:35pm.  Exercise is slowing down a bit.  Her knee has been bothering her and has been swollen a bit.  She is currently on her menstrual cycle.  She has a spa package where she goes to the spa monthly and that is booked thru to December.  She is trying to walk a bit more.  The next month patient has some testing at work, homecoming at [employer].  Evangelia is here to discuss her progress with her obesity treatment plan. She is on the keeping a food journal and adhering to recommended goals of 1150-1300 calories and 85 grams of protein and states she is following her eating plan approximately 60 % of the time. She states she is exercising 45-60 minutes 2-3 times per week.   OBJECTIVE: Visit Diagnoses: Problem List Items Addressed This Visit       Other   Class 2 severe obesity with serious comorbidity and body mass index (BMI) of 39.0 to 39.9 in adult (HCC)   HLD (hyperlipidemia)   Other Visit Diagnoses       Grief    -  Primary     BMI 38.0-38.9,adult           Vitals Temp: 97.6 F (36.4 C) BP: 113/80 Pulse Rate: 70 SpO2: 100 %   Anthropometric Measurements Height: 5' 6 (1.676 m) Weight: 238 lb (108 kg) BMI (Calculated): 38.43 Weight at Last Visit: 236 lb Weight Lost Since Last Visit: 0 Weight Gained Since Last Visit: 2 Starting Weight: 234 lb Total Weight Loss (lbs): 0 lb (0 kg)   Body Composition  Body Fat %: 39.4 % Fat Mass (lbs): 94 lbs Muscle Mass (lbs): 137.4 lbs Total Body Water  (lbs): 91.6 lbs Visceral Fat Rating : 11   Other Clinical Data Today's Visit #: 4 Starting Date: 06/22/24     ASSESSMENT AND PLAN: Assessment & Plan Grief Still working through grief after recent death of her mother.  Patient reports that she feels stressed with many of the  responsibilities of her mother's estate.  No signs or symptoms of abnormal grief patient is going through normal stages of grief.  Continue to support and follow-up at subsequent appointments. Hyperlipidemia, unspecified hyperlipidemia type Patient continuing to monitor her dietary intake to limit saturated fat to be less than 20% total daily intake. Class 2 severe obesity with serious comorbidity and body mass index (BMI) of 39.0 to 39.9 in adult, unspecified obesity type  BMI 38.0-38.9,adult    Diet: Raisha is currently in the action stage of change. As such, her goal is to continue with weight loss efforts and has agreed to keeping a food journal and adhering to recommended goals of 1150-1300 calories and 85 or more grams of protein she also has options for Pescatarian and Category 2 meal plans.  Exercise:  For substantial health benefits, adults should do at least 150 minutes (2 hours and 30 minutes) a week of moderate-intensity, or 75 minutes (1 hour and 15 minutes) a week of vigorous-intensity aerobic physical activity, or an equivalent combination of moderate- and vigorous-intensity aerobic activity. Aerobic activity should be performed in episodes of at least 10 minutes, and preferably, it should be spread throughout the week.  Behavior Modification:  We discussed the following Behavioral Modification Strategies today: increasing lean protein intake,  decreasing simple carbohydrates, increasing vegetables, meal planning and cooking strategies, and keep a strict food journal.   Return in about 3 weeks (around 08/31/2024).   She was informed of the importance of frequent follow up visits to maximize her success with intensive lifestyle modifications for her multiple health conditions.  Attestation Statements:   Reviewed by clinician on day of visit: allergies, medications, problem list, medical history, surgical history, family history, social history, and previous encounter notes.      Adelita Cho, MD

## 2024-08-15 NOTE — Assessment & Plan Note (Signed)
 Patient continuing to monitor her dietary intake to limit saturated fat to be less than 20% total daily intake.

## 2024-08-26 ENCOUNTER — Ambulatory Visit: Admitting: Radiology

## 2024-08-26 NOTE — Progress Notes (Deleted)
 SABRA

## 2024-08-31 ENCOUNTER — Encounter (INDEPENDENT_AMBULATORY_CARE_PROVIDER_SITE_OTHER): Payer: Self-pay | Admitting: Adult Health

## 2024-08-31 ENCOUNTER — Ambulatory Visit (INDEPENDENT_AMBULATORY_CARE_PROVIDER_SITE_OTHER): Admitting: Adult Health

## 2024-08-31 VITALS — BP 105/72 | HR 75 | Temp 97.7°F | Ht 66.0 in | Wt 240.0 lb

## 2024-08-31 DIAGNOSIS — E669 Obesity, unspecified: Secondary | ICD-10-CM

## 2024-08-31 DIAGNOSIS — F4321 Adjustment disorder with depressed mood: Secondary | ICD-10-CM | POA: Diagnosis not present

## 2024-08-31 DIAGNOSIS — Z6838 Body mass index (BMI) 38.0-38.9, adult: Secondary | ICD-10-CM

## 2024-08-31 DIAGNOSIS — Z6839 Body mass index (BMI) 39.0-39.9, adult: Secondary | ICD-10-CM

## 2024-08-31 DIAGNOSIS — E785 Hyperlipidemia, unspecified: Secondary | ICD-10-CM

## 2024-08-31 DIAGNOSIS — Z9884 Bariatric surgery status: Secondary | ICD-10-CM

## 2024-08-31 DIAGNOSIS — E559 Vitamin D deficiency, unspecified: Secondary | ICD-10-CM

## 2024-08-31 MED ORDER — ERGOCALCIFEROL 1.25 MG (50000 UT) PO CAPS
50000.0000 [IU] | ORAL_CAPSULE | ORAL | 0 refills | Status: AC
Start: 2024-08-31 — End: ?

## 2024-08-31 MED ORDER — ERGOCALCIFEROL 1.25 MG (50000 UT) PO CAPS: 50000.0000 [IU] | ORAL_CAPSULE | ORAL | 0 refills | Status: DC

## 2024-08-31 NOTE — Progress Notes (Addendum)
 WEIGHT SUMMARY AND BIOMETRICS  No data recorded Anthropometric Measurements Height: 5' 6 (1.676 m) Weight at Last Visit: 238 lb Starting Weight: 234 lb   No data recorded Other Clinical Data Fasting: no Labs: no Today's Visit #: 5 Starting Date: 06/22/24    Chief Complaint:   OBESITY [name] is here to discuss her progress with her obesity treatment plan.  She is on the the Category 2 Plan and keeping a food journal and adhering to recommended goals of 1150-1300 calories and 85g+ protein and states she is following her eating plan approximately 60 % of the time.  She states she is exercising Walking/Personal Trainer 45/60 minutes 3/3 times per week.  Interim History:  [name] has been tracking intake via paper and pencil. She still has a pretty significant intake restriction, she estimates to consume 1100 cal with 60-65g protein per day.  She is married and lives with her two children, age 44 boy and age 56 girl. She lives in [place], works in [employer]. Her father lives in [place] (1.3 hr trip each way)  She is frustrated with weight loss plateau, current weight 240 lbs Goal Weight 180 lbs She feels that she is experiencing peri-menopausal sx's-she has f/u with her established GYN  Subjective:   1. Hyperlipidemia, unspecified hyperlipidemia type Lipid Panel     Component Value Date/Time   CHOL 215 (H) 06/22/2024 1631   TRIG 68 06/22/2024 1631   HDL 76 06/22/2024 1631   LDLCALC 127 (H) 06/22/2024 1631   LABVLDL 12 06/22/2024 1631    Tot and LDL slightly above goal She is able to exercise at high intensity without CP She is not on statin therapy  2. Vitamin D  deficiency  Latest Reference Range & Units 06/22/24 16:31  Vitamin D , 25-Hydroxy 30.0 - 100.0 ng/mL 71.2   Vit D Level Stable She is on weekly Ergocalciferol  and daily OTC Vit D3 5000 international units  Recommend decreasing Ergocalciferol  from weekly to bi-weekly to avoid over replacement.  3.  H/O bariatric surgery She has a history of obesity, currently weighing 240 pounds, with a peak weight of 306 pounds. She underwent a gastric sleeve procedure in 2021, which initially resulted in weight loss, but her weight has since plateaued.  Her lowest weight post-surgery was 232 pounds, and she fluctuates between 232 and 242 pounds despite efforts such as increased water  intake and walking.   She has been fluctuating between her current weight and about 10lbs heavier since that time.  Goal weight 180 lbs, last time she was at this weight it was prior to her pregnancies.  She still has a pretty significant intake restriction, she estimates to consume 1100 cal with 60-65g protein per day.  4. Grief Her 16 year old mother unexpectedly passed away 07-23-24 Her parents were married for > 54 years. Her father has remained in their home and is independent and ADLs. [name] travels to their home in [place], weekly or bi-weekly to assist with IADLs (bills, home logistics). Ms. Priestly denies SI/HI She is agreeable to referral to Psychology  Assessment/Plan:   1. Hyperlipidemia, unspecified hyperlipidemia type (Primary) Continue healthy eating and regular exercise  2. Vitamin D  deficiency Refill and DECREASE ergocalciferol  (VITAMIN D2) 1.25 MG (50000 UT) capsule Take 1 capsule (50,000 Units total) by mouth every 14 (fourteen) days. Dispense: 4 capsule, Refills: 0 ordered   Continue daily OTC Vit D3 5000 international units  3. H/O bariatric surgery Increase daily protein Continue to  sip on water  throughout day Continue regular exercise  4. Grief Referral to Psychology  5. BMI 39.0-39.9,adult, CURRENT BMI 38.8  Bentli is currently in the action stage of change. As such, her goal is to continue with weight loss efforts. She has agreed to the Category 2 Plan and keeping a food journal and adhering to recommended goals of 1200-1300 calories and 85g+ protein.   Exercise goals: For  substantial health benefits, adults should do at least 150 minutes (2 hours and 30 minutes) a week of moderate-intensity, or 75 minutes (1 hour and 15 minutes) a week of vigorous-intensity aerobic physical activity, or an equivalent combination of moderate- and vigorous-intensity aerobic activity. Aerobic activity should be performed in episodes of at least 10 minutes, and preferably, it should be spread throughout the week.  Behavioral modification strategies: increasing lean protein intake, decreasing simple carbohydrates, increasing vegetables, increasing water  intake, no skipping meals, meal planning and cooking strategies, keeping healthy foods in the home, ways to avoid boredom eating, and planning for success.  [name] has agreed to follow-up with our clinic in 4 weeks. She was informed of the importance of frequent follow-up visits to maximize her success with intensive lifestyle modifications for her multiple health conditions.   Objective:   Height 5' 6 (1.676 m), last menstrual period 08/05/2024. Body mass index is 38.41 kg/m.  General: Cooperative, alert, well developed, in no acute distress. HEENT: Conjunctivae and lids unremarkable. Cardiovascular: Regular rhythm.  Lungs: Normal work of breathing. Neurologic: No focal deficits.   Lab Results  Component Value Date   CREATININE 1.05 (H) 06/22/2024   BUN 12 06/22/2024   NA 142 06/22/2024   K 4.1 06/22/2024   CL 103 06/22/2024   CO2 23 06/22/2024   Lab Results  Component Value Date   ALT 28 06/22/2024   AST 37 06/22/2024   ALKPHOS 99 06/22/2024   BILITOT 0.4 06/22/2024   Lab Results  Component Value Date   HGBA1C 5.0 06/22/2024   HGBA1C 5.3 11/16/2014   Lab Results  Component Value Date   INSULIN  6.9 06/22/2024   Lab Results  Component Value Date   TSH 2.300 06/22/2024   Lab Results  Component Value Date   CHOL 215 (H) 06/22/2024   HDL 76 06/22/2024   LDLCALC 127 (H) 06/22/2024   TRIG 68 06/22/2024   Lab  Results  Component Value Date   VD25OH 71.2 06/22/2024   Lab Results  Component Value Date   WBC 5.3 06/22/2024   HGB 13.6 06/22/2024   HCT 43.0 06/22/2024   MCV 96 06/22/2024   PLT 257 06/22/2024   No results found for: IRON, TIBC, FERRITIN  Attestation Statements:   Reviewed by clinician on day of visit: allergies, medications, problem list, medical history, surgical history, family history, social history, and previous encounter notes.  I have reviewed the above documentation for accuracy and completeness, and I agree with the above. -  Brena Windsor d. Kariss Longmire, NP-C

## 2024-09-01 ENCOUNTER — Other Ambulatory Visit: Payer: Self-pay | Admitting: Radiology

## 2024-09-01 ENCOUNTER — Ambulatory Visit: Admitting: Radiology

## 2024-09-01 ENCOUNTER — Encounter: Payer: Self-pay | Admitting: Radiology

## 2024-09-01 VITALS — BP 104/70 | Ht 65.0 in | Wt 243.0 lb

## 2024-09-01 DIAGNOSIS — N951 Menopausal and female climacteric states: Secondary | ICD-10-CM | POA: Diagnosis not present

## 2024-09-01 DIAGNOSIS — Z1231 Encounter for screening mammogram for malignant neoplasm of breast: Secondary | ICD-10-CM

## 2024-09-01 DIAGNOSIS — Z01419 Encounter for gynecological examination (general) (routine) without abnormal findings: Secondary | ICD-10-CM

## 2024-09-01 DIAGNOSIS — Z1331 Encounter for screening for depression: Secondary | ICD-10-CM

## 2024-09-01 MED ORDER — ESTRADIOL 0.0375 MG/24HR TD PTTW: 1.0000 | MEDICATED_PATCH | TRANSDERMAL | 0 refills | Status: DC

## 2024-09-01 MED ORDER — PROGESTERONE MICRONIZED 100 MG PO CAPS: 100.0000 mg | ORAL_CAPSULE | Freq: Every evening | ORAL | 0 refills | Status: DC

## 2024-09-01 NOTE — Progress Notes (Signed)
 [name] Aug 23, 1976 [id number]   History:  48 y.o. G4P2 presents for annual exam.Complains of perimenopause symptoms, irregular periods, hot flashes, decreased libido, increased facial hair, trouble sleeping. Hx of fibroids. Denies pain or heavy periods. Mother passed away a few months ago. Plans to start therapy soon. Works with healthy weight and wellness to manage weight.  Gynecologic History Patient's last menstrual period was 08/05/2024 (exact date). Period Cycle (Days):  (skipping periods) Period Duration (Days): 6 Period Pattern: (!) Irregular Menstrual Flow: Light, Moderate, Heavy Menstrual Control: Thin pad, Maxi pad, Tampon Dysmenorrhea: (!) Mild Dysmenorrhea Symptoms: Cramping Contraception/Family planning: vasectomy Sexually active: yes Last Pap: 2021. Results were: normal Last mammogram: 2023. Results were: normal Colonoscopy: 12/24  Obstetric History OB History  Gravida Para Term Preterm AB Living  4 2 2  2 2   SAB IAB Ectopic Multiple Live Births  1 1   2     # Outcome Date GA Lbr Len/2nd Weight Sex Type Anes PTL Lv  4 Term 06/09/12 [redacted]w[redacted]d  7 lb 8.8 oz (3.425 kg) F CS-LTranv Spinal  LIV  3 SAB 02/2011          2 Term 04/2005 [redacted]w[redacted]d  8 lb (3.629 kg) M CS-LTranv EPI  LIV  1 IAB 07/2001              09/01/2024   10:18 AM 06/22/2024    9:15 AM  Depression screen PHQ 2/9  Decreased Interest 0 0  Down, Depressed, Hopeless 0 1  PHQ - 2 Score 0 1  Altered sleeping  2  Tired, decreased energy  1  Change in appetite  1  Feeling bad or failure about yourself   0  Trouble concentrating  0  Moving slowly or fidgety/restless  0  Suicidal thoughts  0  PHQ-9 Score  5  Difficult doing work/chores  Not difficult at all     The following portions of the patient's history were reviewed and updated as appropriate: allergies, current medications, past family history, past medical history, past social history, past surgical history, and problem list.  Review of  Systems Pertinent items noted in HPI and remainder of comprehensive ROS otherwise negative.   Past medical history, past surgical history, family history and social history were all reviewed and documented in the EPIC chart.   Exam:  Vitals:   09/01/24 1016  BP: 104/70  Weight: 243 lb (110.2 kg)  Height: 5' 5 (1.651 m)   Body mass index is 40.44 kg/m.  General appearance:  Normal, obese Thyroid :  Symmetrical, normal in size, without palpable masses or nodularity. Respiratory  Auscultation:  Clear without wheezing or rhonchi Cardiovascular  Auscultation:  Regular rate, without rubs, murmurs or gallops  Edema/varicosities:  Not grossly evident Abdominal  Soft,nontender, without masses, guarding or rebound.  Liver/spleen:  No organomegaly noted  Hernia:  None appreciated  Skin  Inspection:  Grossly normal Breasts: Examined lying and sitting.   Right: Without masses, retractions, nipple discharge or axillary adenopathy.   Left: Without masses, retractions, nipple discharge or axillary adenopathy. Genitourinary   Inguinal/mons:  Normal without inguinal adenopathy  External genitalia:  Normal appearing vulva with no masses, tenderness, or lesions  BUS/Urethra/Skene's glands:  Normal without masses or exudate  Vagina:  Normal appearing with normal color and discharge, no lesions  Cervix:  Normal appearing without discharge or lesions  Uterus:  Normal in size, shape and contour.  Mobile, nontender  Adnexa/parametria:     Rt: Normal in size, without  masses or tenderness.   Lt: Normal in size, without masses or tenderness.  Anus and perineum: Normal   [name], CMA present for exam  Assessment/Plan:   1. Well woman exam with routine gynecological exam (Primary) Pap 2026 Schedule mammogram  2. Perimenopausal symptoms Risks and benefits reviewed - estradiol (VIVELLE-DOT) 0.0375 MG/24HR; Place 1 patch onto the skin 2 (two) times a week.  Dispense: 24 patch; Refill: 0 -  progesterone (PROMETRIUM) 100 MG capsule; Take 1 capsule (100 mg total) by mouth at bedtime.  Dispense: 90 capsule; Refill: 0  3. Depression screen    Follow up in 3 months for med check  [name] WHNP-BC 10:23 AM 09/01/2024

## 2024-09-07 NOTE — Telephone Encounter (Signed)
 09/01/24 OV note reviewed. No OTC med recommendations.

## 2024-09-11 ENCOUNTER — Ambulatory Visit
Admission: RE | Admit: 2024-09-11 | Discharge: 2024-09-11 | Disposition: A | Discharge status: Home / Self Care | Source: Ambulatory Visit | Attending: Radiology | Admitting: Radiology

## 2024-09-11 DIAGNOSIS — Z1231 Encounter for screening mammogram for malignant neoplasm of breast: Secondary | ICD-10-CM

## 2024-09-17 LAB — BASIC METABOLIC PANEL WITH GFR
BUN: 15 (ref 4–21)
CO2: 29 — AB (ref 13–22)
Chloride: 105 (ref 99–108)
Creatinine: 1 (ref 0.5–1.1)
Glucose: 83
Potassium: 3.9 meq/L (ref 3.5–5.1)
Sodium: 142 (ref 137–147)

## 2024-09-17 LAB — CBC AND DIFFERENTIAL
HCT: 39 (ref 36–46)
Hemoglobin: 11.9 — AB (ref 12.0–16.0)
Platelets: 293 K/uL (ref 150–400)
WBC: 4.9

## 2024-09-17 LAB — LIPID PANEL
Cholesterol: 203 — AB (ref 0–200)
HDL: 67 (ref 35–70)
LDL Cholesterol: 127
Triglycerides: 46 (ref 40–160)

## 2024-09-17 LAB — CBC: RBC: 4.05 (ref 3.87–5.11)

## 2024-09-17 LAB — HEPATIC FUNCTION PANEL
ALT: 21 U/L (ref 7–35)
AST: 25 (ref 13–35)
Alkaline Phosphatase: 90 (ref 25–125)

## 2024-09-17 LAB — COMPREHENSIVE METABOLIC PANEL WITH GFR
Albumin: 4.1 (ref 3.5–5.0)
Calcium: 9.2 (ref 8.7–10.7)
Globulin: 3.2

## 2024-09-17 LAB — HEMOGLOBIN A1C: Hemoglobin A1C: 5.1

## 2024-09-29 ENCOUNTER — Encounter (INDEPENDENT_AMBULATORY_CARE_PROVIDER_SITE_OTHER): Payer: Self-pay | Admitting: Adult Health

## 2024-09-29 ENCOUNTER — Ambulatory Visit (INDEPENDENT_AMBULATORY_CARE_PROVIDER_SITE_OTHER): Payer: Self-pay | Admitting: Adult Health

## 2024-09-29 ENCOUNTER — Other Ambulatory Visit (INDEPENDENT_AMBULATORY_CARE_PROVIDER_SITE_OTHER): Payer: Self-pay

## 2024-09-29 ENCOUNTER — Encounter (INDEPENDENT_AMBULATORY_CARE_PROVIDER_SITE_OTHER): Payer: Self-pay

## 2024-09-29 VITALS — BP 107/69 | HR 72 | Temp 98.1°F | Ht 66.0 in | Wt 246.0 lb

## 2024-09-29 DIAGNOSIS — E669 Obesity, unspecified: Secondary | ICD-10-CM

## 2024-09-29 DIAGNOSIS — K219 Gastro-esophageal reflux disease without esophagitis: Secondary | ICD-10-CM | POA: Diagnosis not present

## 2024-09-29 DIAGNOSIS — R632 Polyphagia: Secondary | ICD-10-CM

## 2024-09-29 DIAGNOSIS — E785 Hyperlipidemia, unspecified: Secondary | ICD-10-CM

## 2024-09-29 DIAGNOSIS — N951 Menopausal and female climacteric states: Secondary | ICD-10-CM

## 2024-09-29 DIAGNOSIS — Z6839 Body mass index (BMI) 39.0-39.9, adult: Secondary | ICD-10-CM

## 2024-09-29 MED ORDER — METFORMIN HCL 500 MG PO TABS: ORAL_TABLET | ORAL | 0 refills | Status: DC

## 2024-09-29 NOTE — Progress Notes (Signed)
 WEIGHT SUMMARY AND BIOMETRICS  Vitals Temp: 98.1 F (36.7 C) BP: 107/69 Pulse Rate: 72 SpO2: 99 %   Anthropometric Measurements Height: 5' 6 (1.676 m) Weight: 246 lb (111.6 kg) BMI (Calculated): 39.72 Weight at Last Visit: 240lb Weight Lost Since Last Visit: 0lb Weight Gained Since Last Visit: 6lb Starting Weight: 234lb Total Weight Loss (lbs): 0 lb (0 kg)   Body Composition  Body Fat %: 48 % Fat Mass (lbs): 118.2 lbs Muscle Mass (lbs): 121.6 lbs Total Body Water  (lbs): 89.4 lbs Visceral Fat Rating : 14   Other Clinical Data RMR: [id number] Fasting: No Labs: No Today's Visit #: 6 Starting Date: 06/22/24    Chief Complaint:   OBESITY [name] is here to discuss her progress with her obesity treatment plan.  She is on the Intermittent Fasting and states she is following her eating plan approximately 60 % of the time.  She states she is exercising Cardiovascular Exercise 60 minutes 4 times per week.  Interim History:  09/01/2024: She was seen by her established GYN and started on estradiol and progesterone. Labs completed at outside facility- results reviewed with pt  CBC, Plt 239 09/19/2024:She attempted to donate blood and was unable due to clotted off needle. Per pt- Lab Tech shared that she had seen this happen with other ppl trying to donate. She denies tobacco/vape use She denies known blood d/o personally or family hx Last Plt level was normal  She felt that both the Cat 2 MP and the Journaling Plan were too restrictive, therefore she has been following Intermittent Fasting (IF). First meal 1000 Last intake of day 1900  Subjective:   1. Perimenopausal symptoms 10/14/22025 GYN OV Notes History:  48 y.o. G4P2 presents for annual exam.Complains of perimenopause symptoms, irregular periods, hot flashes, decreased libido, increased facial hair, trouble sleeping.  Hx of fibroids. Denies pain or heavy periods. Mother passed away a few months ago. Plans to  start therapy soon. Works with healthy weight and wellness to manage weight.   2. Gastroesophageal reflux disease, unspecified whether esophagitis present omeprazole (PRILOSEC) 40 MG capsule    3. Hyperlipidemia, unspecified hyperlipidemia type Lipid Panel     Component Value Date/Time   CHOL 203 (A) 09/17/2024 0000   CHOL 215 (H) 06/22/2024 1631   TRIG 46 09/17/2024 0000   HDL 67 09/17/2024 0000   HDL 76 06/22/2024 1631   LDLCALC 127 09/17/2024 0000   LDLCALC 127 (H) 06/22/2024 1631   LABVLDL 12 06/22/2024 1631    The 10-year ASCVD risk score (Arnett DK, et al., 2019) is: 0.5%*   Values used to calculate the score:     Age: 48 years     Clincally relevant sex: Female     Is Non-Hispanic African American: Yes     Diabetic: No     Tobacco smoker: No     Systolic Blood Pressure: 107 mmHg     Is BP treated: No     HDL Cholesterol: 67 mg/dL*     Total Cholesterol: 203 mg/dL*     * - Cholesterol units were assumed for this score calculation   Pertinent family hx: her mother passed away from MI at age 4 She denies CP with exertion She denies tobacco/vape use  4. Polyphagia She reports CHO cravings and feel quite fatigued after she consumes simple CHO, ie: bagels Discussed risks/benefits of starting Metformin therapy- she is agreeable to start  Assessment/Plan:   1. Perimenopausal symptoms 09/01/2024 Assessment/Plan:  1. Well woman exam with routine gynecological exam (Primary) Pap 2026 Schedule mammogram   2. Perimenopausal symptoms Risks and benefits reviewed - estradiol (VIVELLE-DOT) 0.0375 MG/24HR; Place 1 patch onto the skin 2 (two) times a week.  Dispense: 24 patch; Refill: 0 - progesterone (PROMETRIUM) 100 MG capsule; Take 1 capsule (100 mg total) by mouth at bedtime.  Dispense: 90 capsule; Refill: 0   3. Depression screen  Follow up in 3 months for med check  2. Gastroesophageal reflux disease, unspecified whether esophagitis present Avoid known trigger  foods Continue daily PPI  3. Hyperlipidemia, unspecified hyperlipidemia type (Primary) Limit sat fat and continue regular exercise  4. Polyphagia Start metFORMIN (GLUCOPHAGE) 500 MG tablet 1/2 tab daily with dinner for one week, then increase to 1 tab daily with dinner- hold at this dose Dispense: 30 tablet, Refills: 0 ordered   5. BMI 39.0-39.9,adult  [name] is not currently in the action stage of change. As such, her goal is to get back to weightloss efforts . She has agreed to Intermittent Fasting and to increase protein to 30g per meal.  Exercise goals: For substantial health benefits, adults should do at least 150 minutes (2 hours and 30 minutes) a week of moderate-intensity, or 75 minutes (1 hour and 15 minutes) a week of vigorous-intensity aerobic physical activity, or an equivalent combination of moderate- and vigorous-intensity aerobic activity. Aerobic activity should be performed in episodes of at least 10 minutes, and preferably, it should be spread throughout the week.  Behavioral modification strategies: increasing lean protein intake, decreasing simple carbohydrates, increasing vegetables, increasing water  intake, decreasing liquid calories, meal planning and cooking strategies, keeping healthy foods in the home, ways to avoid boredom eating, ways to avoid night time snacking, and planning for success.  [name] has agreed to follow-up with our clinic in 4 weeks. She was informed of the importance of frequent follow-up visits to maximize her success with intensive lifestyle modifications for her multiple health conditions.   Check Fasting Labs at next OV  Objective:   Blood pressure 107/69, pulse 72, temperature 98.1 F (36.7 C), height 5' 6 (1.676 m), weight 246 lb (111.6 kg), last menstrual period 08/05/2024, SpO2 99%. Body mass index is 39.71 kg/m.  General: Cooperative, alert, well developed, in no acute distress. HEENT: Conjunctivae and lids  unremarkable. Cardiovascular: Regular rhythm.  Lungs: Normal work of breathing. Neurologic: No focal deficits.   Lab Results  Component Value Date   CREATININE 1.0 09/17/2024   BUN 15 09/17/2024   NA 142 09/17/2024   K 3.9 09/17/2024   CL 105 09/17/2024   CO2 29 (A) 09/17/2024   Lab Results  Component Value Date   ALT 21 09/17/2024   AST 25 09/17/2024   ALKPHOS 90 09/17/2024   BILITOT 0.4 06/22/2024   Lab Results  Component Value Date   HGBA1C 5.1 09/17/2024   HGBA1C 5.0 06/22/2024   HGBA1C 5.3 11/16/2014   Lab Results  Component Value Date   INSULIN  6.9 06/22/2024   Lab Results  Component Value Date   TSH 2.300 06/22/2024   Lab Results  Component Value Date   CHOL 203 (A) 09/17/2024   HDL 67 09/17/2024   LDLCALC 127 09/17/2024   TRIG 46 09/17/2024   Lab Results  Component Value Date   VD25OH 71.2 06/22/2024   Lab Results  Component Value Date   WBC 4.9 09/17/2024   HGB 11.9 (A) 09/17/2024   HCT 39 09/17/2024   MCV 96 06/22/2024   PLT  293 09/17/2024   No results found for: IRON, TIBC, FERRITIN  Attestation Statements:   Reviewed by clinician on day of visit: allergies, medications, problem list, medical history, surgical history, family history, social history, and previous encounter notes.  I have reviewed the above documentation for accuracy and completeness, and I agree with the above. -  Jeet Shough d. Kristin Jergens, NP-C

## 2024-09-30 ENCOUNTER — Encounter: Payer: Self-pay | Admitting: Radiology

## 2024-09-30 ENCOUNTER — Ambulatory Visit: Admitting: Radiology

## 2024-09-30 VITALS — BP 114/76 | Temp 97.9°F | Wt 250.0 lb

## 2024-09-30 DIAGNOSIS — N760 Acute vaginitis: Secondary | ICD-10-CM

## 2024-09-30 DIAGNOSIS — R300 Dysuria: Secondary | ICD-10-CM | POA: Diagnosis not present

## 2024-09-30 LAB — WET PREP FOR TRICH, YEAST, CLUE

## 2024-09-30 MED ORDER — FLUCONAZOLE 150 MG PO TABS: 150.0000 mg | ORAL_TABLET | ORAL | 0 refills | Status: AC

## 2024-09-30 MED ORDER — METRONIDAZOLE 0.75 % VA GEL: 1.0000 | Freq: Every day | VAGINAL | 0 refills | Status: AC

## 2024-09-30 NOTE — Progress Notes (Signed)
      Subjective: [name] is a 48 y.o. female who complains of dysuria, urgency, external vaginal itching, problems with increased weight gain since starting estradiol 00375 mg patch and progesterone 100 mg. Also complains of trouble donating blood 09/19/24 (due to blood clotting)--concerned may be due to HRT.     Review of Systems  All other systems reviewed and are negative.   Past Medical History:  Diagnosis Date   Arthritis    knees, spine   Bunion of left foot 01/2018   Constipation    GERD (gastroesophageal reflux disease)    Obesity    Vitamin D  deficiency       Objective:  Today's Vitals   09/30/24 0941  BP: 114/76  Temp: 97.9 F (36.6 C)  TempSrc: Oral  Weight: 250 lb (113.4 kg)   Body mass index is 40.35 kg/m.   Physical Exam Vitals and nursing note reviewed. Exam conducted with a chaperone present.  Constitutional:      Appearance: Normal appearance. She is well-developed.  Pulmonary:     Effort: Pulmonary effort is normal.  Abdominal:     General: Abdomen is flat.     Palpations: Abdomen is soft.  Genitourinary:    General: Normal vulva.     Vagina: Vaginal discharge present. No erythema, bleeding or lesions.     Cervix: Normal. No discharge, friability, lesion or erythema.     Uterus: Normal.      Adnexa: Right adnexa normal and left adnexa normal.  Neurological:     Mental Status: She is alert.  Psychiatric:        Mood and Affect: Mood normal.        Thought Content: Thought content normal.        Judgment: Judgment normal.    Urine dipstick shows positive for leukocytes.  Micro exam: 0-5 WBC's per HPF.   Microscopic wet-mount exam shows clue cells, hyphae.   [name], CMA present for exam  Assessment:/Plan:   1. Dysuria (Primary) Reassured negative UA - Urinalysis,Complete w/RFL Culture  2. Acute vaginitis +yeast and clue Rx sent for fluconazole  and metrogel  - WET PREP FOR TRICH, YEAST, CLUE    Will contact patient  with results of testing completed today. Avoid intercourse until symptoms are resolved. Safe sex encouraged. Avoid the use of soaps or perfumed products in the peri area. Avoid tub baths and sitting in sweaty or wet clothing for prolonged periods of time.    [name] B, NP 9:58 AM

## 2024-10-03 LAB — URINALYSIS, COMPLETE W/RFL CULTURE
Bacteria, UA: NONE SEEN /HPF
Bilirubin Urine: NEGATIVE
Glucose, UA: NEGATIVE
Hgb urine dipstick: NEGATIVE
Hyaline Cast: NONE SEEN /LPF
Ketones, ur: NEGATIVE
Nitrites, Initial: NEGATIVE
Protein, ur: NEGATIVE
RBC / HPF: NONE SEEN /HPF (ref 0–2)
Specific Gravity, Urine: 1.01 (ref 1.001–1.035)
pH: 6.5 (ref 5.0–8.0)

## 2024-10-03 LAB — URINE CULTURE
MICRO NUMBER:: 17225201
SPECIMEN QUALITY:: ADEQUATE

## 2024-10-03 LAB — CULTURE INDICATED

## 2024-10-05 ENCOUNTER — Ambulatory Visit: Payer: Self-pay | Admitting: Obstetrics and Gynecology

## 2024-10-05 MED ORDER — SULFAMETHOXAZOLE-TRIMETHOPRIM 800-160 MG PO TABS
1.0000 | ORAL_TABLET | Freq: Two times a day (BID) | ORAL | 0 refills | Status: AC
Start: 2024-10-05 — End: ?

## 2024-10-21 ENCOUNTER — Other Ambulatory Visit (INDEPENDENT_AMBULATORY_CARE_PROVIDER_SITE_OTHER): Payer: Self-pay | Admitting: Adult Health

## 2024-10-29 ENCOUNTER — Ambulatory Visit (INDEPENDENT_AMBULATORY_CARE_PROVIDER_SITE_OTHER): Admitting: Adult Health

## 2024-10-29 VITALS — BP 107/72 | HR 69 | Temp 98.1°F | Ht 66.0 in | Wt 245.0 lb

## 2024-10-29 DIAGNOSIS — Z6839 Body mass index (BMI) 39.0-39.9, adult: Secondary | ICD-10-CM

## 2024-10-29 DIAGNOSIS — Z9884 Bariatric surgery status: Secondary | ICD-10-CM | POA: Diagnosis not present

## 2024-10-29 DIAGNOSIS — E559 Vitamin D deficiency, unspecified: Secondary | ICD-10-CM

## 2024-10-29 DIAGNOSIS — E785 Hyperlipidemia, unspecified: Secondary | ICD-10-CM

## 2024-10-29 DIAGNOSIS — R632 Polyphagia: Secondary | ICD-10-CM

## 2024-10-29 DIAGNOSIS — E669 Obesity, unspecified: Secondary | ICD-10-CM | POA: Diagnosis not present

## 2024-10-29 MED ORDER — TOPIRAMATE 25 MG PO TABS: 25.0000 mg | ORAL_TABLET | Freq: Every day | ORAL | 0 refills | Status: DC

## 2024-10-29 MED ORDER — METFORMIN HCL 500 MG PO TABS: ORAL_TABLET | ORAL | 0 refills | Status: DC

## 2024-10-29 NOTE — Progress Notes (Signed)
 WEIGHT SUMMARY AND BIOMETRICS  Vitals Temp: 98.1 F (36.7 C) BP: 107/72 Pulse Rate: 69 SpO2: 100 %   Anthropometric Measurements Height: 5' 6 (1.676 m) Weight: 245 lb (111.1 kg) BMI (Calculated): 39.56 Weight at Last Visit: 246 lb Weight Lost Since Last Visit: 1 lb Weight Gained Since Last Visit: 0 Starting Weight: 234 lb Total Weight Loss (lbs): 0 lb (0 kg)   Body Composition  Body Fat %: 46.5 % Fat Mass (lbs): 114 lbs Muscle Mass (lbs): 124.6 lbs Total Body Water  (lbs): 85.41 lbs Visceral Fat Rating : 13   Other Clinical Data Fasting: yes Labs: yes Today's Visit #: 7 Starting Date: 06/22/24    Chief Complaint:   OBESITY [name] is here to discuss her progress with her obesity treatment plan.  She is on the Intermittent Fasting (IF) and states she is following her eating plan approximately 80 % of the time.  She states she is exercising Gym 45 mins 2 x week, Personal Trainer 60 mins 3 x week    Interim History:  She feels that her clothes are fitting better. She has titrated up to one full tablet Metformin  500mg - she will take with dinner meal. She denies GI upset  She endorses persistent polyphagia. PCP previously managed Topamax- she would like to restart  Reviewed Bioimpedance Results with pt: Muscle Mass: +3 lbs Adipose Mass: -4.2 lbs  Subjective:   1. Polyphagia 09/29/2024 started on Metformin -  titrated up to one full tablet Metformin  500mg - she will take with dinner meal. She endorses persistent polyphagia. PCP previously managed Topamax- she would like to restart She denies hx of seizure d/o or nephrolithiasis Her husband has had a successful vasectomy  2. Hyperlipidemia, unspecified hyperlipidemia type Lipid Panel     Component Value Date/Time   CHOL 203 (A) 09/17/2024 0000   CHOL 215 (H) 06/22/2024 1631   TRIG 46 09/17/2024 0000   HDL 67 09/17/2024 0000   HDL 76 06/22/2024 1631   LDLCALC 127 09/17/2024 0000   LDLCALC 127  (H) 06/22/2024 1631   LABVLDL 12 06/22/2024 1631    She denies CP with exertion She has been increasing exercise duration and intensity  3. Vitamin D  deficiency  Latest Reference Range & Units 06/22/24 16:31  Vitamin D , 25-Hydroxy 30.0 - 100.0 ng/mL 71.2   Vit D Level stable  4. H/O bariatric surgery She has a history of obesity, currently weighing 240 pounds, with a peak weight of 306 pounds. She underwent a gastric sleeve procedure in 2021, which initially resulted in weight loss, but her weight has since plateaued.  Her lowest weight post-surgery was 232 pounds, and she fluctuates between 232 and 242 pounds despite efforts such as increased water  intake and walking.    She has been fluctuating between her current weight and about 10lbs heavier since that time.  Goal weight 180 lbs, last time she was at this weight it was prior to her pregnancies.   She has been following IF eating plan  Assessment/Plan:   1. Polyphagia (Primary) Refill - metFORMIN  (GLUCOPHAGE ) 500 MG tablet; 1 tab daily with largest meal  Dispense: 30 tablet; Refill: 0 RESTART - topiramate (TOPAMAX) 25 MG tablet; Take 1 tablet (25 mg total) by mouth daily.  Dispense: 30 tablet; Refill: 0 Check Labs - Hemoglobin A1c - Insulin , random  2. Hyperlipidemia, unspecified hyperlipidemia type Check Labs - Comprehensive metabolic panel with GFR  3. Vitamin D  deficiency Check Labs - VITAMIN D  25 Hydroxy (Vit-D Deficiency,  Fractures)  4. H/O bariatric surgery Consume at least 30g protein per meal  5. BMI 39.0-39.9,adult, CURRENT BMI 39.6  Tiny is currently in the action stage of change. As such, her goal is to continue with weight loss efforts. She has agreed to IF  Exercise goals: For substantial health benefits, adults should do at least 150 minutes (2 hours and 30 minutes) a week of moderate-intensity, or 75 minutes (1 hour and 15 minutes) a week of vigorous-intensity aerobic physical activity, or an  equivalent combination of moderate- and vigorous-intensity aerobic activity. Aerobic activity should be performed in episodes of at least 10 minutes, and preferably, it should be spread throughout the week.  Behavioral modification strategies: increasing lean protein intake, decreasing simple carbohydrates, increasing vegetables, increasing water  intake, no skipping meals, meal planning and cooking strategies, keeping healthy foods in the home, ways to avoid boredom eating, and planning for success.  Lalanya has agreed to follow-up with our clinic in 4 weeks. She was informed of the importance of frequent follow-up visits to maximize her success with intensive lifestyle modifications for her multiple health conditions.   Gracemarie was informed we would discuss her lab results at her next visit unless there is a critical issue that needs to be addressed sooner. Shavona agreed to keep her next visit at the agreed upon time to discuss these results.  Objective:   Blood pressure 107/72, pulse 69, temperature 98.1 F (36.7 C), height 5' 6 (1.676 m), weight 245 lb (111.1 kg), SpO2 100%. Body mass index is 39.54 kg/m.  General: Cooperative, alert, well developed, in no acute distress. HEENT: Conjunctivae and lids unremarkable. Cardiovascular: Regular rhythm.  Lungs: Normal work of breathing. Neurologic: No focal deficits.   Lab Results  Component Value Date   CREATININE 1.0 09/17/2024   BUN 15 09/17/2024   NA 142 09/17/2024   K 3.9 09/17/2024   CL 105 09/17/2024   CO2 29 (A) 09/17/2024   Lab Results  Component Value Date   ALT 21 09/17/2024   AST 25 09/17/2024   ALKPHOS 90 09/17/2024   BILITOT 0.4 06/22/2024   Lab Results  Component Value Date   HGBA1C 5.1 09/17/2024   HGBA1C 5.0 06/22/2024   HGBA1C 5.3 11/16/2014   Lab Results  Component Value Date   INSULIN  6.9 06/22/2024   Lab Results  Component Value Date   TSH 2.300 06/22/2024   Lab Results  Component Value Date   CHOL 203  (A) 09/17/2024   HDL 67 09/17/2024   LDLCALC 127 09/17/2024   TRIG 46 09/17/2024   Lab Results  Component Value Date   VD25OH 71.2 06/22/2024   Lab Results  Component Value Date   WBC 4.9 09/17/2024   HGB 11.9 (A) 09/17/2024   HCT 39 09/17/2024   MCV 96 06/22/2024   PLT 293 09/17/2024   No results found for: IRON, TIBC, FERRITIN  Attestation Statements:   Reviewed by clinician on day of visit: allergies, medications, problem list, medical history, surgical history, family history, social history, and previous encounter notes.  I have reviewed the above documentation for accuracy and completeness, and I agree with the above. -  Lujean Ebright d. Alante Weimann, NP-C

## 2024-10-30 LAB — COMPREHENSIVE METABOLIC PANEL WITH GFR
ALT: 22 IU/L (ref 0–32)
AST: 28 IU/L (ref 0–40)
Albumin: 4.1 g/dL (ref 3.9–4.9)
Alkaline Phosphatase: 104 IU/L (ref 41–116)
BUN/Creatinine Ratio: 18 (ref 9–23)
BUN: 17 mg/dL (ref 6–24)
Bilirubin Total: 0.4 mg/dL (ref 0.0–1.2)
CO2: 24 mmol/L (ref 20–29)
Calcium: 9.4 mg/dL (ref 8.7–10.2)
Chloride: 103 mmol/L (ref 96–106)
Creatinine, Ser: 0.95 mg/dL (ref 0.57–1.00)
Globulin, Total: 3.4 g/dL (ref 1.5–4.5)
Glucose: 84 mg/dL (ref 70–99)
Potassium: 4.5 mmol/L (ref 3.5–5.2)
Sodium: 143 mmol/L (ref 134–144)
Total Protein: 7.5 g/dL (ref 6.0–8.5)
eGFR: 74 mL/min/1.73 (ref >59)

## 2024-10-30 LAB — INSULIN, RANDOM: INSULIN: 9.9 u[IU]/mL (ref 2.6–24.9)

## 2024-10-30 LAB — VITAMIN D 25 HYDROXY (VIT D DEFICIENCY, FRACTURES): Vit D, 25-Hydroxy: 49.7 ng/mL (ref 30.0–100.0)

## 2024-10-30 LAB — HEMOGLOBIN A1C
Est. average glucose Bld gHb Est-mCnc: 103 mg/dL
Hgb A1c MFr Bld: 5.2 % (ref 4.8–5.6)

## 2024-11-17 ENCOUNTER — Ambulatory Visit: Admitting: Radiology

## 2024-11-17 ENCOUNTER — Encounter: Payer: Self-pay | Admitting: Radiology

## 2024-11-17 VITALS — BP 104/62 | Wt 254.0 lb

## 2024-11-17 DIAGNOSIS — N951 Menopausal and female climacteric states: Secondary | ICD-10-CM

## 2024-11-17 MED ORDER — ESTRADIOL 0.075 MG/24HR TD PTTW: 1.0000 | MEDICATED_PATCH | TRANSDERMAL | 1 refills | Status: DC

## 2024-11-17 MED ORDER — PROGESTERONE MICRONIZED 100 MG PO CAPS: 100.0000 mg | ORAL_CAPSULE | Freq: Every evening | ORAL | 1 refills | Status: DC

## 2024-11-17 NOTE — Progress Notes (Signed)
" ° °  [name] 01-09-1976 [id number]   History:  48 y.o. G4P2 here for follow up after using HRT. Still having hot flashes, mood changes, trouble sleeping. Unable to lose weight.   Gynecologic History Patient's last menstrual period was 10/14/2024 (exact date).   Contraception/Family planning: vasectomy Sexually active: yes   Obstetric History OB History  Gravida Para Term Preterm AB Living  4 2 2  2 2   SAB IAB Ectopic Multiple Live Births  1 1   2     # Outcome Date GA Lbr Len/2nd Weight Sex Type Anes PTL Lv  4 Term 06/09/12 [redacted]w[redacted]d  7 lb 8.8 oz (3.425 kg) F CS-LTranv Spinal  LIV  3 SAB 02/2011          2 Term 04/2005 [redacted]w[redacted]d  8 lb (3.629 kg) M CS-LTranv EPI  LIV  1 IAB 07/2001              The following portions of the patient's history were reviewed and updated as appropriate: allergies, current medications, past family history, past medical history, past social history, past surgical history, and problem list.  ROS  Past medical history, past surgical history, family history and social history were all reviewed and documented in the EPIC chart.  Exam:  Vitals:   11/17/24 0824  BP: 104/62  Weight: 254 lb (115.2 kg)   Body mass index is 41 kg/m.  Physical Exam Constitutional:      Appearance: Normal appearance. She is obese.  Pulmonary:     Effort: Pulmonary effort is normal.  Neurological:     Mental Status: She is alert.  Psychiatric:        Mood and Affect: Mood normal.        Thought Content: Thought content normal.        Judgment: Judgment normal.      [name], CMA present for exam  Assessment/Plan:   1. Perimenopausal symptoms (Primary) Will increase doses to help with symptoms. - estradiol  (VIVELLE -DOT) 0.075 MG/24HR; Place 1 patch onto the skin 2 (two) times a week.  Dispense: 24 patch; Refill: 1 - progesterone  (PROMETRIUM ) 100 MG capsule; Take 1 capsule (100 mg total) by mouth at bedtime.  Dispense: 180 capsule; Refill: 1   Schedule visit  next month for GLP-1 consult, she is interested in oral wegovy.  [name] B WHNP-BC 9:00 AM 11/17/2024 "

## 2024-11-22 ENCOUNTER — Other Ambulatory Visit: Payer: Self-pay | Admitting: Radiology

## 2024-11-22 DIAGNOSIS — N951 Menopausal and female climacteric states: Secondary | ICD-10-CM

## 2024-11-23 NOTE — Telephone Encounter (Signed)
 Med refill request:     Progesterone  (PROMETRIUM ) 100 MG capsule  Start:  11/17/24 Disp:   180 capsules Refills:  1  Last AEX:  09/01/24 Next OV:  12/09/24 Last MMG (if hormonal med):  09/11/24 Refill authorized? Please Advise.

## 2024-12-02 ENCOUNTER — Ambulatory Visit (INDEPENDENT_AMBULATORY_CARE_PROVIDER_SITE_OTHER): Admitting: Adult Health

## 2024-12-02 VITALS — BP 104/71 | HR 67 | Temp 98.7°F | Ht 66.0 in | Wt 245.0 lb

## 2024-12-02 DIAGNOSIS — Z6839 Body mass index (BMI) 39.0-39.9, adult: Secondary | ICD-10-CM

## 2024-12-02 DIAGNOSIS — R632 Polyphagia: Secondary | ICD-10-CM

## 2024-12-02 DIAGNOSIS — E785 Hyperlipidemia, unspecified: Secondary | ICD-10-CM

## 2024-12-02 DIAGNOSIS — E559 Vitamin D deficiency, unspecified: Secondary | ICD-10-CM | POA: Diagnosis not present

## 2024-12-02 MED ORDER — TOPIRAMATE 25 MG PO TABS: 25.0000 mg | ORAL_TABLET | Freq: Every day | ORAL | 0 refills | Status: DC

## 2024-12-02 MED ORDER — TOPIRAMATE 50 MG PO TABS: 50.0000 mg | ORAL_TABLET | Freq: Every day | ORAL | 0 refills | Status: AC

## 2024-12-02 MED ORDER — METFORMIN HCL 500 MG PO TABS: ORAL_TABLET | ORAL | 0 refills | Status: AC

## 2024-12-02 NOTE — Progress Notes (Signed)
 "    WEIGHT SUMMARY AND BIOMETRICS  Vitals Temp: 98.7 F (37.1 C) BP: 104/71 Pulse Rate: 67 SpO2: 99 %   Anthropometric Measurements Height: 5' 6 (1.676 m) Weight: 245 lb (111.1 kg) BMI (Calculated): 39.56 Weight at Last Visit: 245lb Weight Lost Since Last Visit: 0lb Weight Gained Since Last Visit: 0lb Starting Weight: 234lb Total Weight Loss (lbs): 0 lb (0 kg)   Body Composition  Body Fat %: 47.4 % Fat Mass (lbs): 116.2 lbs Muscle Mass (lbs): 122.4 lbs Total Body Water  (lbs): 88 lbs Visceral Fat Rating : 13   Other Clinical Data Fasting: No Labs: No Today's Visit #: 8 Starting Date: 06/22/24    Chief Complaint:   OBESITY [name] is here to discuss her progress with her obesity treatment plan.  She is on the Intermittent Fasting 14/10 and states she is following her eating plan approximately 90 % of the time.  She states she is exercising Strength Training and Walking 55 minutes 4 times per week.   Interim History:  Intermittent Fasting 14/10 system  She reports consuming the following foods: Overnight oats Cheese Sweet potato Green beans Protein bars Protein shakes Grilled chicken  She denies sx's of hypoglycemia   Subjective:   1. Hyperlipidemia, unspecified hyperlipidemia type  Lipid Panel     Component Value Date/Time   CHOL 203 (A) 09/17/2024 0000   CHOL 215 (H) 06/22/2024 1631   TRIG 46 09/17/2024 0000   HDL 67 09/17/2024 0000   HDL 76 06/22/2024 1631   LDLCALC 127 09/17/2024 0000   LDLCALC 127 (H) 06/22/2024 1631   LABVLDL 12 06/22/2024 1631    Tot and LDL elevated She is not on statin therapy  2. Polyphagia She was restarted on Topamax  25mg  at last OV She has been taking in the evening She denies paresthesias or word finding problems. She would like to increase to 50mg  nightly dose She has continued Metformin  500mg - 1 tab daily  3. Vitamin D  deficiency  Latest Reference Range & Units 10/29/24 08:37  Vitamin D ,  25-Hydroxy 30.0 - 100.0 ng/mL 49.7   Vit D Level stable She is on daily OTC Vit D 3 5000 and OTC MVI  Assessment/Plan:   1. Hyperlipidemia, unspecified hyperlipidemia type (Primary) Limit sat fat and continue high intensity exercise  2. Polyphagia Refill  - metFORMIN  (GLUCOPHAGE ) 500 MG tablet; 1 tab daily with largest meal  Dispense: 30 tablet; Refill: 0 Refill and INCREASE - topiramate  (TOPAMAX ) 55 MG tablet; Take 1 tablet (25 mg total) by mouth daily.  Dispense: 30 tablet; Refill: 0  3. Vitamin D  deficiency Continue current supplementation  4. BMI 39.0-39.9,adult, CURRENT BMI 39.6  Fortune is currently in the action stage of change. As such, her goal is to continue with weight loss efforts. She has agreed to Intermittent Fasting 14/10   Exercise goals: For additional and more extensive health benefits, adults should increase their aerobic physical activity to 300 minutes (5 hours) a week of moderate-intensity, or 150 minutes a week of vigorous-intensity aerobic physical activity, or an equivalent combination of moderate- and vigorous-intensity activity. Additional health benefits are gained by engaging in physical activity beyond this amount.   Behavioral modification strategies: increasing lean protein intake, decreasing simple carbohydrates, increasing vegetables, increasing water  intake, meal planning and cooking strategies, keeping healthy foods in the home, ways to avoid boredom eating, and planning for success.  Auria has agreed to follow-up with our clinic in 4 weeks. She was informed of the importance of frequent  follow-up visits to maximize her success with intensive lifestyle modifications for her multiple health conditions.   Review Lab results at next OV  Objective:   Blood pressure 104/71, pulse 67, temperature 98.7 F (37.1 C), height 5' 6 (1.676 m), weight 245 lb (111.1 kg), last menstrual period 10/14/2024, SpO2 99%. Body mass index is 39.54 kg/m.  General:  Cooperative, alert, well developed, in no acute distress. HEENT: Conjunctivae and lids unremarkable. Cardiovascular: Regular rhythm.  Lungs: Normal work of breathing. Neurologic: No focal deficits.   Lab Results  Component Value Date   CREATININE 0.95 10/29/2024   BUN 17 10/29/2024   NA 143 10/29/2024   K 4.5 10/29/2024   CL 103 10/29/2024   CO2 24 10/29/2024   Lab Results  Component Value Date   ALT 22 10/29/2024   AST 28 10/29/2024   ALKPHOS 104 10/29/2024   BILITOT 0.4 10/29/2024   Lab Results  Component Value Date   HGBA1C 5.2 10/29/2024   HGBA1C 5.1 09/17/2024   HGBA1C 5.0 06/22/2024   HGBA1C 5.3 11/16/2014   Lab Results  Component Value Date   INSULIN  9.9 10/29/2024   INSULIN  6.9 06/22/2024   Lab Results  Component Value Date   TSH 2.300 06/22/2024   Lab Results  Component Value Date   CHOL 203 (A) 09/17/2024   HDL 67 09/17/2024   LDLCALC 127 09/17/2024   TRIG 46 09/17/2024   Lab Results  Component Value Date   VD25OH 49.7 10/29/2024   VD25OH 71.2 06/22/2024   Lab Results  Component Value Date   WBC 4.9 09/17/2024   HGB 11.9 (A) 09/17/2024   HCT 39 09/17/2024   MCV 96 06/22/2024   PLT 293 09/17/2024   No results found for: IRON, TIBC, FERRITIN  Attestation Statements:   Reviewed by clinician on day of visit: allergies, medications, problem list, medical history, surgical history, family history, social history, and previous encounter notes.  I have reviewed the above documentation for accuracy and completeness, and I agree with the above. -  Lorrene Graef d. Keleigh Kazee, NP-C "

## 2024-12-09 ENCOUNTER — Ambulatory Visit: Admitting: Radiology

## 2024-12-09 ENCOUNTER — Encounter: Payer: Self-pay | Admitting: Radiology

## 2024-12-09 VITALS — BP 112/76 | Wt 251.0 lb

## 2024-12-09 DIAGNOSIS — E6609 Other obesity due to excess calories: Secondary | ICD-10-CM

## 2024-12-09 DIAGNOSIS — Z6841 Body Mass Index (BMI) 40.0 and over, adult: Secondary | ICD-10-CM

## 2024-12-09 DIAGNOSIS — N951 Menopausal and female climacteric states: Secondary | ICD-10-CM | POA: Diagnosis not present

## 2024-12-09 MED ORDER — PROGESTERONE MICRONIZED 100 MG PO CAPS: 100.0000 mg | ORAL_CAPSULE | Freq: Every day | ORAL | 3 refills | Status: AC

## 2024-12-09 MED ORDER — ESTRADIOL 0.075 MG/24HR TD PTTW: 1.0000 | MEDICATED_PATCH | TRANSDERMAL | 3 refills | Status: AC

## 2024-12-09 MED ORDER — WEGOVY 1.5 MG PO TABS: 1.5000 mg | ORAL_TABLET | Freq: Every day | ORAL | 0 refills | Status: AC

## 2024-12-09 MED ORDER — WEGOVY 4 MG PO TABS: 4.0000 mg | ORAL_TABLET | Freq: Every day | ORAL | 0 refills | Status: AC

## 2024-12-09 NOTE — Progress Notes (Signed)
" ° °  [name] Jul 31, 1976 [id number]   History:  49 y.o. G4P2 here for follow up after increase HRT dosing. Doing much better since increase. Also, ready to start oral wegovy . management  Gynecologic History Patient's last menstrual period was 11/16/2024 (exact date).   Obstetric History OB History  Gravida Para Term Preterm AB Living  4 2 2  2 2   SAB IAB Ectopic Multiple Live Births  1 1   2     # Outcome Date GA Lbr Len/2nd Weight Sex Type Anes PTL Lv  4 Term 06/09/12 [redacted]w[redacted]d  7 lb 8.8 oz (3.425 kg) F CS-LTranv Spinal  LIV  3 SAB 02/2011          2 Term 04/2005 [redacted]w[redacted]d  8 lb (3.629 kg) M CS-LTranv EPI  LIV  1 IAB 07/2001             Review of Systems  All other systems reviewed and are negative.   Past medical history, past surgical history, family history and social history were all reviewed and documented in the EPIC chart.  Exam:  Vitals:   12/09/24 1510  BP: 112/76  Weight: 251 lb (113.9 kg)   Body mass index is 40.51 kg/m.  Physical Exam Constitutional:      Appearance: Normal appearance. She is obese.  Pulmonary:     Effort: Pulmonary effort is normal.  Neurological:     Mental Status: She is alert.  Psychiatric:        Mood and Affect: Mood normal.        Thought Content: Thought content normal.        Judgment: Judgment normal.      Current weight:251 First goal:225 Overall weight loss goal:200  Assessment/Plan:   1. BMI 40.0-44.9, adult (HCC) (Primary) Risks and benefits reviewed Will weigh weekly and log progress Will contact me at week 7 with her progress and tolerability before increasing to the next dose. Aware this is a long term medication. - semaglutide -weight management (WEGOVY ) 1.5 MG tablet; Take 1 tablet (1.5 mg total) by mouth daily. Daily in AM on an empty stomach with 4 oz of water . Do not eat or drink for 30 minutes after dose.  Dispense: 30 tablet; Refill: 0 - semaglutide -weight management (WEGOVY ) 4 MG tablet; Take 1 tablet (4 mg  total) by mouth daily. Daily in morning on an empty stomach with 4 oz of water . Do not eat or drink for 30 minutes after dose.  Dispense: 30 tablet; Refill: 0  2. Perimenopausal symptoms - estradiol  (VIVELLE -DOT) 0.075 MG/24HR; Place 1 patch onto the skin 2 (two) times a week.  Dispense: 24 patch; Refill: 3 - progesterone  (PROMETRIUM ) 100 MG capsule; Take 1 capsule (100 mg total) by mouth at bedtime.  Dispense: 90 capsule; Refill: 3     Risks and benefits discussed. Continue to focus on protein and fiber. Small frequent meals. Adequate hydration and electrolyte replacement. Will continue to exercise for at least a week including weight bearing exercise.   Return in about 3 months (around 03/09/2025) for Med Follow-up.  Lestine Rahe B WHNP-BC 3:24 PM 12/09/2024  "

## 2025-01-06 ENCOUNTER — Ambulatory Visit (INDEPENDENT_AMBULATORY_CARE_PROVIDER_SITE_OTHER): Admitting: Adult Health

## 2025-03-10 ENCOUNTER — Ambulatory Visit: Admitting: Radiology
